# Patient Record
Sex: Female | Born: 1965 | Race: Black or African American | Hispanic: No | Smoking: Never smoker
Health system: Southern US, Community
[De-identification: ages and names within clinical notes are randomized; demographics above are authoritative.]

## PROBLEM LIST (undated history)

## (undated) DIAGNOSIS — E669 Obesity, unspecified: Secondary | ICD-10-CM

## (undated) DIAGNOSIS — K08409 Partial loss of teeth, unspecified cause, unspecified class: Secondary | ICD-10-CM

## (undated) HISTORY — DX: Obesity, unspecified: E66.9

---

## 2019-09-24 DIAGNOSIS — L821 Other seborrheic keratosis: Secondary | ICD-10-CM | POA: Diagnosis not present

## 2019-09-24 DIAGNOSIS — L811 Chloasma: Secondary | ICD-10-CM | POA: Diagnosis not present

## 2019-10-21 ENCOUNTER — Other Ambulatory Visit: Payer: Self-pay

## 2019-10-21 ENCOUNTER — Ambulatory Visit
Admission: EM | Admit: 2019-10-21 | Discharge: 2019-10-21 | Disposition: A | Discharge status: Home / Self Care | Payer: BC Managed Care – PPO | Attending: Family Medicine | Admitting: Family Medicine

## 2019-10-21 DIAGNOSIS — K029 Dental caries, unspecified: Secondary | ICD-10-CM | POA: Diagnosis not present

## 2019-10-21 MED ORDER — AMOXICILLIN-POT CLAVULANATE 875-125 MG PO TABS: 1.0000 | ORAL_TABLET | Freq: Two times a day (BID) | ORAL | 0 refills | Status: DC

## 2019-10-21 MED ORDER — KETOROLAC TROMETHAMINE 10 MG PO TABS: 10.0000 mg | ORAL_TABLET | Freq: Four times a day (QID) | ORAL | 0 refills | Status: DC | PRN

## 2019-10-21 NOTE — Discharge Instructions (Signed)
Medication as directed.  Find yourself a dentist.  Take care  Dr. Adriana Simas

## 2019-10-21 NOTE — ED Triage Notes (Signed)
Pt reports cracked upper left tooth.  Was in process of full dental eval then COVID hit and she didn't want to go to dentist.  Now new to area and needs to re-establish with dentist. Unable to drink hot/cold. Feels like air is hitting the tooth.  Constant tooth pain now causing headache.  Alternating Tylenol and Motrin isn't stopping pain.

## 2019-10-21 NOTE — ED Provider Notes (Signed)
[facility]    CSN: [id number] Arrival date & time: 10/21/19  1714      History   Chief Complaint Chief Complaint  Patient presents with  . Dental Pain   HPI  54 year old female presents with dental pain.  Patient reports that she has pain of a tooth of the left upper dentition that has been bothering her the past 2 days.  She states that she has broken the tooth.  This has been an ongoing issue intermittently.  It has been bothering her more so over the past 2 days.  She has not seen the dentist recently.  She has recently relocated to [place].  She states that she has worsening pain when she drinks hot and cold things.  Pain is constant.  8/10 in severity.  She states that the pain shoots up her face.  She is taking Tylenol Motrin without resolution.  No fever.  No jaw/facial swelling.  No other complaints or concerns at this time.   Past Surgical History:  Procedure Laterality Date  . CESAREAN SECTION  1994   OB History   No obstetric history on file.    Home Medications    Prior to Admission medications   Medication Sig Start Date End Date Taking? Authorizing Provider  amoxicillin-clavulanate (AUGMENTIN) 875-125 MG tablet Take 1 tablet by mouth every 12 (twelve) hours. 10/21/19   Coral Spikes, DO  ketorolac (TORADOL) 10 MG tablet Take 1 tablet (10 mg total) by mouth every 6 (six) hours as needed for moderate pain or severe pain. Do not take with ibuprofen or other NSAID's. 10/21/19   Coral Spikes, DO    Family History Family History  Problem Relation Age of Onset  . Healthy Father     Social History Social History   Tobacco Use  . Smoking status: Never Smoker  . Smokeless tobacco: Never Used  Substance Use Topics  . Alcohol use: Never  . Drug use: Never     Allergies   Percocet [oxycodone-acetaminophen]   Review of Systems Review of Systems  Constitutional: Negative for fever.  HENT: Positive for dental problem. Negative for facial  swelling.    Physical Exam Triage Vital Signs ED Triage Vitals  Enc Vitals Group     BP 10/21/19 1738 116/79     Pulse Rate 10/21/19 1738 88     Resp 10/21/19 1738 18     Temp 10/21/19 1738 98.3 F (36.8 C)     Temp Source 10/21/19 1738 Oral     SpO2 10/21/19 1738 100 %     Weight --      Height --      Head Circumference --      Peak Flow --      Pain Score 10/21/19 1731 8     Pain Loc --      Pain Edu? --      Excl. in [place]? --    Updated Vital Signs BP 116/79 (BP Location: Left Arm)   Pulse 88   Temp 98.3 F (36.8 C) (Oral)   Resp 18   LMP 10/15/2019 (Exact Date)   SpO2 100%   Visual Acuity Right Eye Distance:   Left Eye Distance:   Bilateral Distance:    Right Eye Near:   Left Eye Near:    Bilateral Near:     Physical Exam Vitals and nursing note reviewed.  Constitutional:      General: She is not in acute distress.  Appearance: Normal appearance. She is not ill-appearing.  HENT:     Head: Normocephalic and atraumatic.     Mouth/Throat:     Comments: Poor dentition throughout.  Patient has numerous decaying teeth. Eyes:     General:        Right eye: No discharge.        Left eye: No discharge.     Conjunctiva/sclera: Conjunctivae normal.  Cardiovascular:     Rate and Rhythm: Normal rate and regular rhythm.     Heart sounds: No murmur.  Pulmonary:     Effort: Pulmonary effort is normal.     Breath sounds: Normal breath sounds. No wheezing, rhonchi or rales.  Neurological:     Mental Status: She is alert.  Psychiatric:        Mood and Affect: Mood normal.        Behavior: Behavior normal.    UC Treatments / Results  Labs (all labs ordered are listed, but only abnormal results are displayed) Labs Reviewed - No data to display  EKG   Radiology No results found.  Procedures Procedures (including critical care time)  Medications Ordered in UC Medications - No data to display  Initial Impression / Assessment and Plan / UC Course  I  have reviewed the triage vital signs and the nursing notes.  Pertinent labs & imaging results that were available during my care of the patient were reviewed by me and considered in my medical decision making (see chart for details).    54 year old female presents with dental pain.  Covering with Augmentin.  Placing on Toradol for pain.  Information given regarding local dentist.  Supportive care.  Final Clinical Impressions(s) / UC Diagnoses   Final diagnoses:  Pain due to dental caries     Discharge Instructions     Medication as directed.  Find yourself a dentist.  Take care  Dr. Adriana Simas    ED Prescriptions    Medication Sig Dispense Auth. Provider   amoxicillin-clavulanate (AUGMENTIN) 875-125 MG tablet Take 1 tablet by mouth every 12 (twelve) hours. 20 tablet Velmer Woelfel G, DO   ketorolac (TORADOL) 10 MG tablet Take 1 tablet (10 mg total) by mouth every 6 (six) hours as needed for moderate pain or severe pain. Do not take with ibuprofen or other NSAID's. 20 tablet Tommie Sams, DO     PDMP not reviewed this encounter.   Tommie Sams, [place] 10/21/19 Paulo Fruit

## 2019-10-28 ENCOUNTER — Telehealth: Payer: Self-pay | Admitting: Emergency Medicine

## 2019-10-28 MED ORDER — IBUPROFEN 800 MG PO TABS: 800.0000 mg | ORAL_TABLET | Freq: Three times a day (TID) | ORAL | 0 refills | Status: DC

## 2019-10-28 NOTE — Telephone Encounter (Signed)
Patient called.Toradol 10 mg as prescribed to her is not working.  She is requesting Motrin 800 mg.  We will E prescribe this to the pharmacy on record.  Ibuprofen 800 mg 3 times daily for 10 days #30.   Domenick Gong, MD

## 2019-10-28 NOTE — Telephone Encounter (Signed)
Patient called requesting pain medicine be changed to motrin.  Patient states medicine ordered is not helping.  Patient is working on follow up care.    Spoke to dr [employer].  Dr Chaney Malling ordered motrin to listed pharmacy.   Notified patient medicine was called in to pharmacy.  Patient then requested pharmacy be changed to [facility].  Resent script to preferred pharmacy.    Patient reports she had difficulty with insurance and [facility], therefore requested switch.  Reported difficulty made getting medicine after [facility] visit difficult, reports it was 2 days later than expected.

## 2020-01-18 ENCOUNTER — Encounter (HOSPITAL_BASED_OUTPATIENT_CLINIC_OR_DEPARTMENT_OTHER): Payer: Self-pay | Admitting: Emergency Medicine

## 2020-01-18 ENCOUNTER — Emergency Department (HOSPITAL_BASED_OUTPATIENT_CLINIC_OR_DEPARTMENT_OTHER)
Admission: EM | Admit: 2020-01-18 | Discharge: 2020-01-18 | Disposition: A | Discharge status: Home / Self Care | Payer: BC Managed Care – PPO | Attending: Emergency Medicine | Admitting: Emergency Medicine

## 2020-01-18 ENCOUNTER — Ambulatory Visit (HOSPITAL_COMMUNITY)
Admission: EM | Admit: 2020-01-18 | Discharge: 2020-01-18 | Disposition: A | Discharge status: Home / Self Care | Payer: BC Managed Care – PPO | Source: Home / Self Care

## 2020-01-18 ENCOUNTER — Other Ambulatory Visit: Payer: Self-pay

## 2020-01-18 ENCOUNTER — Emergency Department (HOSPITAL_BASED_OUTPATIENT_CLINIC_OR_DEPARTMENT_OTHER): Payer: BC Managed Care – PPO

## 2020-01-18 DIAGNOSIS — M7632 Iliotibial band syndrome, left leg: Secondary | ICD-10-CM | POA: Diagnosis not present

## 2020-01-18 DIAGNOSIS — M79605 Pain in left leg: Secondary | ICD-10-CM | POA: Diagnosis not present

## 2020-01-18 MED ORDER — IBUPROFEN 800 MG PO TABS
800.0000 mg | ORAL_TABLET | Freq: Once | ORAL | Status: AC
  Administered 2020-01-18: 800 mg via ORAL
  Filled 2020-01-18: qty 1

## 2020-01-18 MED ORDER — ACETAMINOPHEN 500 MG PO TABS
1000.0000 mg | ORAL_TABLET | Freq: Once | ORAL | Status: AC
  Administered 2020-01-18: 1000 mg via ORAL
  Filled 2020-01-18: qty 2

## 2020-01-18 MED ORDER — IBUPROFEN 800 MG PO TABS: 800.0000 mg | ORAL_TABLET | Freq: Three times a day (TID) | ORAL | 0 refills | Status: DC

## 2020-01-18 MED ORDER — ACETAMINOPHEN 500 MG PO TABS: 1000.0000 mg | ORAL_TABLET | Freq: Four times a day (QID) | ORAL | 0 refills | Status: DC | PRN

## 2020-01-18 NOTE — ED Provider Notes (Signed)
[facility] EMERGENCY DEPARTMENT Provider Note   CSN: [id number] Arrival date & time: 01/18/20  1742     History Chief Complaint  Patient presents with  . Leg Pain    Kayla Frazier is a 54 y.o. female.  HPI Patient reports he started getting pain on the outside of her left knee about 3 days ago.  It was fairly uncomfortable but has gotten much worse.  She reports there is a very tender spot just on the outside aspect of her left knee and a little below the knee.  She reports the pain seems to radiate down the side of her leg a bit and then she noticed that her blood vessels on the front of her leg seemed really swollen earlier today.  She is worried about a blood clot to the leg.  She did start working out last week.  That was a new activity for her.  No specific injury.  She reports that it is worse if she turns her leg in her outwards.  She has not seen any actual swelling or redness.    History reviewed. No pertinent past medical history.  There are no problems to display for this patient.   Past Surgical History:  Procedure Laterality Date  . CESAREAN SECTION  1994     OB History   No obstetric history on file.     Family History  Problem Relation Age of Onset  . Healthy Father     Social History   Tobacco Use  . Smoking status: Never Smoker  . Smokeless tobacco: Never Used  Vaping Use  . Vaping Use: Never used  Substance Use Topics  . Alcohol use: Never  . Drug use: Never    Home Medications Prior to Admission medications   Medication Sig Start Date End Date Taking? Authorizing Provider  acetaminophen (TYLENOL) 500 MG tablet Take 2 tablets (1,000 mg total) by mouth every 6 (six) hours as needed. 01/18/20   Arby Barrette, MD  amoxicillin-clavulanate (AUGMENTIN) 875-125 MG tablet Take 1 tablet by mouth every 12 (twelve) hours. 10/21/19   Tommie Sams, DO  ibuprofen (ADVIL) 800 MG tablet Take 1 tablet (800 mg total) by mouth 3 (three) times  daily. 10/28/19   Domenick Gong, MD  ibuprofen (ADVIL) 800 MG tablet Take 1 tablet (800 mg total) by mouth 3 (three) times daily. 01/18/20   Arby Barrette, MD  ketorolac (TORADOL) 10 MG tablet Take 1 tablet (10 mg total) by mouth every 6 (six) hours as needed for moderate pain or severe pain. Do not take with ibuprofen or other NSAID's. 10/21/19   Tommie Sams, DO    Allergies    Percocet [oxycodone-acetaminophen]  Review of Systems   Review of Systems Constitutional: No fever no chills no malaise Respiratory: No chest pain no shortness of breath Physical Exam Updated Vital Signs BP 107/81 (BP Location: Right Arm)   Pulse 74   Temp 98.4 F (36.9 C) (Oral)   Resp 18   Ht 5\' 4"  (1.626 m)   Wt 86.2 kg   SpO2 100%   BMI 32.61 kg/m   Physical Exam Constitutional:      Comments: Alert and well in appearance.  No respiratory distress.  Pulmonary:     Effort: Pulmonary effort is normal.  Musculoskeletal:     Comments: Patient has focal area of tenderness on the lateral left knee.  This coincides with the iliotibial band.  No effusion to the left knee.  No  warmth or redness to the knee.  No fullness behind the knee in the popliteal fossa.  The calf is soft and pliable.  No remarkably engorged blood vessels.  Ankle without effusion.  Foot warm and dry 2+ pedal pulses bilateral feet.  Pain is exacerbated by internally and externally rotating the lower leg.  Skin:    General: Skin is warm and dry.  Neurological:     General: No focal deficit present.     Mental Status: She is oriented to person, place, and time.     Coordination: Coordination normal.  Psychiatric:        Mood and Affect: Mood normal.     ED Results / Procedures / Treatments   Labs (all labs ordered are listed, but only abnormal results are displayed) Labs Reviewed - No data to display  EKG None  Radiology US Venous Img Lower Unilateral Left  Result Date: 01/18/2020 CLINICAL DATA:  Left leg pain EXAM: LEFT  LOWER EXTREMITY VENOUS DOPPLER ULTRASOUND TECHNIQUE: Gray-scale sonography with compression, as well as color and duplex ultrasound, were performed to evaluate the deep venous system(s) from the level of the common femoral vein through the popliteal and proximal calf veins. COMPARISON:  None. FINDINGS: VENOUS Normal compressibility of the common femoral, superficial femoral, and popliteal veins, as well as the visualized calf veins. Visualized portions of profunda femoral vein and great saphenous vein unremarkable. No filling defects to suggest DVT on grayscale or color Doppler imaging. Doppler waveforms show normal direction of venous flow, normal respiratory plasticity and response to augmentation. Limited views of the contralateral common femoral vein are unremarkable. OTHER None. Limitations: none IMPRESSION: Negative. Electronically Signed   By: Rolm Baptise M.D.   On: 01/18/2020 19:40    Procedures Procedures (including critical care time)  Medications Ordered in ED Medications  ibuprofen (ADVIL) tablet 800 mg (800 mg Oral Given 01/18/20 2241)  acetaminophen (TYLENOL) tablet 1,000 mg (1,000 mg Oral Given 01/18/20 2241)    ED Course  I have reviewed the triage vital signs and the nursing notes.  Pertinent labs & imaging results that were available during my care of the patient were reviewed by me and considered in my medical decision making (see chart for details).    MDM Rules/Calculators/A&P                          Patient has developed a focus of tenderness to the lateral left knee.  No effusions present no erythema.  She had concern for DVT.  Ultrasound is negative.  Patient has recently started working out.  The focus of pain corresponds with the iliotibial band and radiates down the lateral leg somewhat.  Suspect musculoskeletal strain from overuse.  Will have patient use ibuprofen and Tylenol and icing.  Recommend follow-up with orthopedics for recheck. Final Clinical Impression(s) /  ED Diagnoses Final diagnoses:  It band syndrome, left    Rx / DC Orders ED Discharge Orders         Ordered    ibuprofen (ADVIL) 800 MG tablet  3 times daily     Discontinue  Reprint     01/18/20 2235    acetaminophen (TYLENOL) 500 MG tablet  Every 6 hours PRN     Discontinue  Reprint     01/18/20 2235           Charlesetta Shanks, MD 01/18/20 2247

## 2020-01-18 NOTE — Discharge Instructions (Signed)
Elevate and ice your knee. Take ibuprofen and tylenol for pain. Schedule a recheck with the orthopedic doctor, dr. Magnus Ivan. Return to the ED if you get additional or worsening symptoms.

## 2020-01-18 NOTE — ED Triage Notes (Signed)
L leg pain x 3 days. Denies injury.

## 2020-02-03 ENCOUNTER — Ambulatory Visit: Payer: BC Managed Care – PPO | Admitting: Emergency Medicine

## 2020-02-04 ENCOUNTER — Encounter: Payer: Self-pay | Admitting: Emergency Medicine

## 2020-02-17 ENCOUNTER — Telehealth: Payer: Self-pay | Admitting: General Practice

## 2020-02-17 NOTE — Telephone Encounter (Signed)
Called patient in response to online request to schedule as a new patient. LVM for patient to call the office to schedule. No preferences listed.

## 2020-03-15 ENCOUNTER — Ambulatory Visit: Payer: BC Managed Care – PPO | Admitting: Family Medicine

## 2020-04-16 ENCOUNTER — Ambulatory Visit: Payer: BC Managed Care – PPO | Admitting: Family Medicine

## 2020-04-16 ENCOUNTER — Other Ambulatory Visit: Payer: Self-pay

## 2020-04-16 ENCOUNTER — Encounter: Payer: Self-pay | Admitting: Family Medicine

## 2020-04-16 VITALS — BP 110/68 | HR 75 | Temp 97.4°F | Ht 63.0 in | Wt 200.0 lb

## 2020-04-16 DIAGNOSIS — R5383 Other fatigue: Secondary | ICD-10-CM | POA: Diagnosis not present

## 2020-04-16 DIAGNOSIS — E669 Obesity, unspecified: Secondary | ICD-10-CM | POA: Insufficient documentation

## 2020-04-16 DIAGNOSIS — Z0000 Encounter for general adult medical examination without abnormal findings: Secondary | ICD-10-CM | POA: Diagnosis not present

## 2020-04-16 NOTE — Progress Notes (Signed)
New Patient Office Visit  Subjective:  Patient ID: Kayla Frazier, female    DOB: November 21, 1965  Age: 54 y.o. MRN: [id number]  CC:  Chief Complaint  Patient presents with  . Establish Care    New patient     HPI Kayla Frazier presents for a physical exam and establishment of care.  She is particularly concerned about a 30 pound weight gain in the last 10 years of her life.  She does not feel that she has been overeating.  She otherwise feels healthy and has no chronic medical problems that she is aware of.  She is established with a dentist.  She asked for referral to a GYN provider.  She is not currently physically active.  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  She lives with her 22 year old son.  Her father is 77 and in relatively good health.  Her mom died in her 40s and had suffered from diabetes and chronic kidney disease. History reviewed. No pertinent past medical history.  Past Surgical History:  Procedure Laterality Date  . CESAREAN SECTION  1994  . CESAREAN SECTION N/A    [employer]rees[employer] 02/03/2020    Family History  Problem Relation Age of Onset  . Healthy Father     Social History   Socioeconomic History  . Marital status: Legally Separated    Spouse name: Not on file  . Number of children: Not on file  . Years of education: Not on file  . Highest education level: Not on file  Occupational History  . Not on file  Tobacco Use  . Smoking status: Never Smoker  . Smokeless tobacco: Never Used  Vaping Use  . Vaping Use: Never used  Substance and Sexual Activity  . Alcohol use: Never  . Drug use: Never  . Sexual activity: Not on file  Other Topics Concern  . Not on file  Social History Narrative  . Not on file   Social Determinants of Health   Financial Resource Strain:   . Difficulty of Paying Living Expenses: Not on file  Food Insecurity:   . Worried About Programme researcher, broadcasting/film/video in the Last Year: Not on file  . Ran Out of Food in the Last Year: Not on  file  Transportation Needs:   . Lack of Transportation (Medical): Not on file  . Lack of Transportation (Non-Medical): Not on file  Physical Activity:   . Days of Exercise per Week: Not on file  . Minutes of Exercise per Session: Not on file  Stress:   . Feeling of Stress : Not on file  Social Connections:   . Frequency of Communication with Friends and Family: Not on file  . Frequency of Social Gatherings with Friends and Family: Not on file  . Attends Religious Services: Not on file  . Active Member of Clubs or Organizations: Not on file  . Attends Banker Meetings: Not on file  . Marital Status: Not on file  Intimate Partner Violence:   . Fear of Current or Ex-Partner: Not on file  . Emotionally Abused: Not on file  . Physically Abused: Not on file  . Sexually Abused: Not on file    ROS Review of Systems  Constitutional: Negative.   HENT: Negative.   Eyes: Negative for photophobia and visual disturbance.  Respiratory: Negative.   Cardiovascular: Negative.   Gastrointestinal: Negative.   Endocrine: Negative for polyphagia and polyuria.  Genitourinary: Negative.   Musculoskeletal: Negative for gait problem  and joint swelling.  Skin: Negative for pallor and rash.  Allergic/Immunologic: Negative for immunocompromised state.  Neurological: Negative for tremors and speech difficulty.  Hematological: Does not bruise/bleed easily.  Psychiatric/Behavioral: Negative.        Objective:   Today's Vitals: BP 110/68   Pulse 75   Temp (!) 97.4 F (36.3 C) (Tympanic)   Ht 5\' 3"  (1.6 m)   Wt 200 lb (90.7 kg)   SpO2 98%   BMI 35.43 kg/m   Physical Exam Vitals and nursing note reviewed.  Constitutional:      General: She is not in acute distress.    Appearance: Normal appearance. She is obese. She is diaphoretic. She is not ill-appearing or toxic-appearing.  HENT:     Head: Normocephalic and atraumatic.     Right Ear: Ear canal and external ear normal.      Left Ear: Tympanic membrane, ear canal and external ear normal.     Mouth/Throat:     Mouth: Mucous membranes are moist.     Pharynx: Oropharynx is clear. No oropharyngeal exudate or posterior oropharyngeal erythema.  Eyes:     General: No scleral icterus.       Right eye: No discharge.        Left eye: No discharge.     Conjunctiva/sclera: Conjunctivae normal.     Pupils: Pupils are equal, round, and reactive to light.  Cardiovascular:     Rate and Rhythm: Normal rate and regular rhythm.  Pulmonary:     Effort: Pulmonary effort is normal.     Breath sounds: Normal breath sounds.  Abdominal:     General: Bowel sounds are normal.  Musculoskeletal:     Cervical back: Normal range of motion. No rigidity or tenderness.     Right lower leg: No edema.     Left lower leg: No edema.  Lymphadenopathy:     Cervical: No cervical adenopathy.  Skin:    General: Skin is warm.  Neurological:     Mental Status: She is alert and oriented to person, place, and time.  Psychiatric:        Mood and Affect: Mood normal.        Behavior: Behavior normal.     Assessment & Plan:   Problem List Items Addressed This Visit      Other   Fatigue   Relevant Orders   TSH   Healthcare maintenance - Primary   Relevant Orders   CBC   Comprehensive metabolic panel   HIV Antibody (routine testing w rflx)   Lipid panel   Urinalysis, Routine w reflex microscopic   Ambulatory referral to Gynecology   Ambulatory referral to Gastroenterology   Morbid obesity [facility])   Relevant Orders   Amb ref to Medical Nutrition Therapy-MNT      Outpatient Encounter Medications as of 04/16/2020  Medication Sig  . acetaminophen (TYLENOL) 500 MG tablet Take 2 tablets (1,000 mg total) by mouth every 6 (six) hours as needed.  . [DISCONTINUED] amoxicillin-clavulanate (AUGMENTIN) 875-125 MG tablet Take 1 tablet by mouth every 12 (twelve) hours. (Patient not taking: Reported on 04/16/2020)  . [DISCONTINUED] ibuprofen  (ADVIL) 800 MG tablet Take 1 tablet (800 mg total) by mouth 3 (three) times daily. (Patient not taking: Reported on 04/16/2020)  . [DISCONTINUED] ibuprofen (ADVIL) 800 MG tablet Take 1 tablet (800 mg total) by mouth 3 (three) times daily. (Patient not taking: Reported on 04/16/2020)  . [DISCONTINUED] ketorolac (TORADOL) 10 MG tablet Take 1 tablet (  10 mg total) by mouth every 6 (six) hours as needed for moderate pain or severe pain. Do not take with ibuprofen or other NSAID's. (Patient not taking: Reported on 04/16/2020)   No facility-administered encounter medications on file as of 04/16/2020.    Follow-up: Return in about 1 year (around 04/16/2021), or Return fasting for blood work..   Given information on health maintenance and disease prevention.  She was also given information on BMI and counting calories to lose weight.  Suggested that she write down everything that she eats and try to measure portions.  She agrees to meet with a nutritionist, follow-up with GYN and go for consultation for colonoscopy.  Mliss Sax, MD

## 2020-04-16 NOTE — Patient Instructions (Signed)
BMI for Adults What is BMI? Body mass index (BMI) is a number that is calculated from a person's weight and height. BMI can help estimate how much of a person's weight is composed of fat. BMI does not measure body fat directly. Rather, it is an alternative to procedures that directly measure body fat, which can be difficult and expensive. BMI can help identify people who may be at higher risk for certain medical problems. What are BMI measurements used for? BMI is used as a screening tool to identify possible weight problems. It helps determine whether a person is obese, overweight, a healthy weight, or underweight. BMI is useful for:  Identifying a weight problem that may be related to a medical condition or may increase the risk for medical problems.  Promoting changes, such as changes in diet and exercise, to help reach a healthy weight. BMI screening can be repeated to see if these changes are working. How is BMI calculated? BMI involves measuring your weight in relation to your height. Both height and weight are measured, and the BMI is calculated from those numbers. This can be done either in [place] ([place] or metric measurements. Note that charts and online BMI calculators are available to help you find your BMI quickly and easily without having to do these calculations yourself. To calculate your BMI in [place] ([place] measurements:  1. Measure your weight in pounds (lb). 2. Multiply the number of pounds by 703. ? For example, for a person who weighs 180 lb, multiply that number by 703, which equals 126,540. 3. Measure your height in inches. Then multiply that number by itself to get a measurement called "inches squared." ? For example, for a person who is 70 inches tall, the "inches squared" measurement is 70 inches x 70 inches, which equals 4,900 inches squared. 4. Divide the total from step 2 (number of lb x 703) by the total from step 3 (inches squared): 126,540  4,900 = 25.8. This is  your BMI. To calculate your BMI in metric measurements: 1. Measure your weight in kilograms (kg). 2. Measure your height in meters (m). Then multiply that number by itself to get a measurement called "meters squared." ? For example, for a person who is 1.75 m tall, the "meters squared" measurement is 1.75 m x 1.75 m, which is equal to 3.1 meters squared. 3. Divide the number of kilograms (your weight) by the meters squared number. In this example: 70  3.1 = 22.6. This is your BMI. What do the results mean? BMI charts are used to identify whether you are underweight, normal weight, overweight, or obese. The following guidelines will be used:  Underweight: BMI less than 18.5.  Normal weight: BMI between 18.5 and 24.9.  Overweight: BMI between 25 and 29.9.  Obese: BMI of 30 or above. Keep these notes in mind:  Weight includes both fat and muscle, so someone with a muscular build, such as an athlete, may have a BMI that is higher than 24.9. In cases like these, BMI is not an accurate measure of body fat.  To determine if excess body fat is the cause of a BMI of 25 or higher, further assessments may need to be done by a health care provider.  BMI is usually interpreted in the same way for men and women. Where to find more information For more information about BMI, including tools to quickly calculate your BMI, go to these websites:  Centers for Disease Control and Prevention: http://www.wolf.info/  American  Heart Association: www.heart.org  National Heart, Lung, and Blood Institute: https://wilson-eaton.com/ Summary  Body mass index (BMI) is a number that is calculated from a person's weight and height.  BMI may help estimate how much of a person's weight is composed of fat. BMI can help identify those who may be at higher risk for certain medical problems.  BMI can be measured using English measurements or metric measurements.  BMI charts are used to identify whether you are underweight, normal  weight, overweight, or obese. This information is not intended to replace advice given to you by your health care provider. Make sure you discuss any questions you have with your health care provider. Document Revised: 04/02/2019 Document Reviewed: 02/07/2019 Elsevier Patient Education  [place] Maintenance, Female Adopting a healthy lifestyle and getting preventive care are important in promoting health and wellness. Ask your health care provider about:  The right schedule for you to have regular tests and exams.  Things you can do on your own to prevent diseases and keep yourself healthy. What should I know about diet, weight, and exercise? Eat a healthy diet   Eat a diet that includes plenty of vegetables, fruits, low-fat dairy products, and lean protein.  Do not eat a lot of foods that are high in solid fats, added sugars, or sodium. Maintain a healthy weight Body mass index (BMI) is used to identify weight problems. It estimates body fat based on height and weight. Your health care provider can help determine your BMI and help you achieve or maintain a healthy weight. Get regular exercise Get regular exercise. This is one of the most important things you can do for your health. Most adults should:  Exercise for at least 150 minutes each week. The exercise should increase your heart rate and make you sweat (moderate-intensity exercise).  Do strengthening exercises at least twice a week. This is in addition to the moderate-intensity exercise.  Spend less time sitting. Even light physical activity can be beneficial. Watch cholesterol and blood lipids Have your blood tested for lipids and cholesterol at 52 years of age, then have this test every 5 years. Have your cholesterol levels checked more often if:  Your lipid or cholesterol levels are high.  You are older than 52 years of age.  You are at high risk for heart disease. What should I know about cancer  screening? Depending on your health history and family history, you may need to have cancer screening at various ages. This may include screening for:  Breast cancer.  Cervical cancer.  Colorectal cancer.  Skin cancer.  Lung cancer. What should I know about heart disease, diabetes, and high blood pressure? Blood pressure and heart disease  High blood pressure causes heart disease and increases the risk of stroke. This is more likely to develop in people who have high blood pressure readings, are of African descent, or are overweight.  Have your blood pressure checked: ? Every 3-5 years if you are 71-76 years of age. ? Every year if you are 45 years old or older. Diabetes Have regular diabetes screenings. This checks your fasting blood sugar level. Have the screening done:  Once every three years after age 45 if you are at a normal weight and have a low risk for diabetes.  More often and at a younger age if you are overweight or have a high risk for diabetes. What should I know about preventing infection? Hepatitis B If you have a higher risk  for hepatitis B, you should be screened for this virus. Talk with your health care provider to find out if you are at risk for hepatitis B infection. Hepatitis C Testing is recommended for:  Everyone born from [birth year] through 1963.  Anyone with known risk factors for hepatitis C. Sexually transmitted infections (STIs)  Get screened for STIs, including gonorrhea and chlamydia, if: ? You are sexually active and are younger than 52 years of age. ? You are older than 52 years of age and your health care provider tells you that you are at risk for this type of infection. ? Your sexual activity has changed since you were last screened, and you are at increased risk for chlamydia or gonorrhea. Ask your health care provider if you are at risk.  Ask your health care provider about whether you are at high risk for HIV. Your health care provider may  recommend a prescription medicine to help prevent HIV infection. If you choose to take medicine to prevent HIV, you should first get tested for HIV. You should then be tested every 3 months for as long as you are taking the medicine. Pregnancy  If you are about to stop having your period (premenopausal) and you may become pregnant, seek counseling before you get pregnant.  Take 400 to 800 micrograms (mcg) of folic acid every day if you become pregnant.  Ask for birth control (contraception) if you want to prevent pregnancy. Osteoporosis and menopause Osteoporosis is a disease in which the bones lose minerals and strength with aging. This can result in bone fractures. If you are 82 years old or older, or if you are at risk for osteoporosis and fractures, ask your health care provider if you should:  Be screened for bone loss.  Take a calcium or vitamin D supplement to lower your risk of fractures.  Be given hormone replacement therapy (HRT) to treat symptoms of menopause. Follow these instructions at home: Lifestyle  Do not use any products that contain nicotine or tobacco, such as cigarettes, e-cigarettes, and chewing tobacco. If you need help quitting, ask your health care provider.  Do not use street drugs.  Do not share needles.  Ask your health care provider for help if you need support or information about quitting drugs. Alcohol use  Do not drink alcohol if: ? Your health care provider tells you not to drink. ? You are pregnant, may be pregnant, or are planning to become pregnant.  If you drink alcohol: ? Limit how much you use to 0-1 drink a day. ? Limit intake if you are breastfeeding.  Be aware of how much alcohol is in your drink. In the [place]., one drink equals one 12 oz bottle of beer (355 mL), one 5 oz glass of wine (148 mL), or one 1 oz glass of hard liquor (44 mL). General instructions  Schedule regular health, dental, and eye exams.  Stay current with your  vaccines.  Tell your health care provider if: ? You often feel depressed. ? You have ever been abused or do not feel safe at home. Summary  Adopting a healthy lifestyle and getting preventive care are important in promoting health and wellness.  Follow your health care provider's instructions about healthy diet, exercising, and getting tested or screened for diseases.  Follow your health care provider's instructions on monitoring your cholesterol and blood pressure. This information is not intended to replace advice given to you by your health care provider. Make sure you discuss any  questions you have with your health care provider. Document Revised: 07/03/2018 Document Reviewed: 07/03/2018 Elsevier Patient Education  2020 Elsevier Inc.  Preventive Care 54-59 Years Old, Female Preventive care refers to visits with your health care provider and lifestyle choices that can promote health and wellness. This includes:  A yearly physical exam. This may also be called an annual well check.  Regular dental visits and eye exams.  Immunizations.  Screening for certain conditions.  Healthy lifestyle choices, such as eating a healthy diet, getting regular exercise, not using drugs or products that contain nicotine and tobacco, and limiting alcohol use. What can I expect for my preventive care visit? Physical exam Your health care provider will check your:  Height and weight. This may be used to calculate body mass index (BMI), which tells if you are at a healthy weight.  Heart rate and blood pressure.  Skin for abnormal spots. Counseling Your health care provider may ask you questions about your:  Alcohol, tobacco, and drug use.  Emotional well-being.  Home and relationship well-being.  Sexual activity.  Eating habits.  Work and work Statistician.  Method of birth control.  Menstrual cycle.  Pregnancy history. What immunizations do I need?  Influenza (flu)  vaccine  This is recommended every year. Tetanus, diphtheria, and pertussis (Tdap) vaccine  You may need a Td booster every 10 years. Varicella (chickenpox) vaccine  You may need this if you have not been vaccinated. Zoster (shingles) vaccine  You may need this after age 51. Measles, mumps, and rubella (MMR) vaccine  You may need at least one dose of MMR if you were born in 1957 or later. You may also need a second dose. Pneumococcal conjugate (PCV13) vaccine  You may need this if you have certain conditions and were not previously vaccinated. Pneumococcal polysaccharide (PPSV23) vaccine  You may need one or two doses if you smoke cigarettes or if you have certain conditions. Meningococcal conjugate (MenACWY) vaccine  You may need this if you have certain conditions. Hepatitis A vaccine  You may need this if you have certain conditions or if you travel or work in places where you may be exposed to hepatitis A. Hepatitis B vaccine  You may need this if you have certain conditions or if you travel or work in places where you may be exposed to hepatitis B. Haemophilus influenzae type b (Hib) vaccine  You may need this if you have certain conditions. Human papillomavirus (HPV) vaccine  If recommended by your health care provider, you may need three doses over 6 months. You may receive vaccines as individual doses or as more than one vaccine together in one shot (combination vaccines). Talk with your health care provider about the risks and benefits of combination vaccines. What tests do I need? Blood tests  Lipid and cholesterol levels. These may be checked every 5 years, or more frequently if you are over 2 years old.  Hepatitis C test.  Hepatitis B test. Screening  Lung cancer screening. You may have this screening every year starting at age 34 if you have a 30-pack-year history of smoking and currently smoke or have quit within the past 15 years.  Colorectal cancer  screening. All adults should have this screening starting at age 61 and continuing until age 19. Your health care provider may recommend screening at age 31 if you are at increased risk. You will have tests every 1-10 years, depending on your results and the type of screening test.  Diabetes  screening. This is done by checking your blood sugar (glucose) after you have not eaten for a while (fasting). You may have this done every 1-3 years.  Mammogram. This may be done every 1-2 years. Talk with your health care provider about when you should start having regular mammograms. This may depend on whether you have a family history of breast cancer.  BRCA-related cancer screening. This may be done if you have a family history of breast, ovarian, tubal, or peritoneal cancers.  Pelvic exam and Pap test. This may be done every 3 years starting at age 61. Starting at age 72, this may be done every 5 years if you have a Pap test in combination with an HPV test. Other tests  Sexually transmitted disease (STD) testing.  Bone density scan. This is done to screen for osteoporosis. You may have this scan if you are at high risk for osteoporosis. Follow these instructions at home: Eating and drinking  Eat a diet that includes fresh fruits and vegetables, whole grains, lean protein, and low-fat dairy.  Take vitamin and mineral supplements as recommended by your health care provider.  Do not drink alcohol if: ? Your health care provider tells you not to drink. ? You are pregnant, may be pregnant, or are planning to become pregnant.  If you drink alcohol: ? Limit how much you have to 0-1 drink a day. ? Be aware of how much alcohol is in your drink. In the [place] one drink equals one 12 oz bottle of beer (355 mL), one 5 oz glass of wine (148 mL), or one 1 oz glass of hard liquor (44 mL). Lifestyle  Take daily care of your teeth and gums.  Stay active. Exercise for at least 30 minutes on 5 or more days  each week.  Do not use any products that contain nicotine or tobacco, such as cigarettes, e-cigarettes, and chewing tobacco. If you need help quitting, ask your health care provider.  If you are sexually active, practice safe sex. Use a condom or other form of birth control (contraception) in order to prevent pregnancy and STIs (sexually transmitted infections).  If told by your health care provider, take low-dose aspirin daily starting at age 96. What's next?  Visit your health care provider once a year for a well check visit.  Ask your health care provider how often you should have your eyes and teeth checked.  Stay up to date on all vaccines. This information is not intended to replace advice given to you by your health care provider. Make sure you discuss any questions you have with your health care provider. Document Revised: 03/21/2018 Document Reviewed: 03/21/2018 Elsevier Patient Education  2020 [place] [employer] Calories are units of energy. Your body needs a certain amount of calories from food to keep you going throughout the day. When you eat more calories than your body needs, your body stores the extra calories as fat. When you eat fewer calories than your body needs, your body burns fat to get the energy it needs. Calorie counting means keeping track of how many calories you eat and drink each day. Calorie counting can be helpful if you need to lose weight. If you make sure to eat fewer calories than your body needs, you should lose weight. Ask your health care provider what a healthy weight is for you. For calorie counting to work, you will need to eat the right number of calories in a day  in order to lose a healthy amount of weight per week. A dietitian can help you determine how many calories you need in a day and will give you suggestions on how to reach your calorie goal.  A healthy amount of weight to lose per week is usually 1-2 lb (0.5-0.9  kg). This usually means that your daily calorie intake should be reduced by 500-750 calories.  Eating 1,200 - 1,500 calories per day can help most women lose weight.  Eating 1,500 - 1,800 calories per day can help most men lose weight. What is my plan? My goal is to have __________ calories per day. If I have this many calories per day, I should lose around __________ pounds per week. What do I need to know about calorie counting? In order to meet your daily calorie goal, you will need to:  Find out how many calories are in each food you would like to eat. Try to do this before you eat.  Decide how much of the food you plan to eat.  Write down what you ate and how many calories it had. Doing this is called keeping a food log. To successfully lose weight, it is important to balance calorie counting with a healthy lifestyle that includes regular activity. Aim for 150 minutes of moderate exercise (such as walking) or 75 minutes of vigorous exercise (such as running) each week. Where do I find calorie information?  The number of calories in a food can be found on a Nutrition Facts label. If a food does not have a Nutrition Facts label, try to look up the calories online or ask your dietitian for help. Remember that calories are listed per serving. If you choose to have more than one serving of a food, you will have to multiply the calories per serving by the amount of servings you plan to eat. For example, the label on a package of bread might say that a serving size is 1 slice and that there are 90 calories in a serving. If you eat 1 slice, you will have eaten 90 calories. If you eat 2 slices, you will have eaten 180 calories. How do I keep a food log? Immediately after each meal, record the following information in your food log:  What you ate. Don't forget to include toppings, sauces, and other extras on the food.  How much you ate. This can be measured in cups, ounces, or number of  items.  How many calories each food and drink had.  The total number of calories in the meal. Keep your food log near you, such as in a small notebook in your pocket, or use a mobile app or website. Some programs will calculate calories for you and show you how many calories you have left for the day to meet your goal. What are some calorie counting tips?   Use your calories on foods and drinks that will fill you up and not leave you hungry: ? Some examples of foods that fill you up are nuts and nut butters, vegetables, lean proteins, and high-fiber foods like whole grains. High-fiber foods are foods with more than 5 g fiber per serving. ? Drinks such as sodas, specialty coffee drinks, alcohol, and juices have a lot of calories, yet do not fill you up.  Eat nutritious foods and avoid empty calories. Empty calories are calories you get from foods or beverages that do not have many vitamins or protein, such as candy, sweets, and soda. It  is better to have a nutritious high-calorie food (such as an avocado) than a food with few nutrients (such as a bag of chips).  Know how many calories are in the foods you eat most often. This will help you calculate calorie counts faster.  Pay attention to calories in drinks. Low-calorie drinks include water and unsweetened drinks.  Pay attention to nutrition labels for "low fat" or "fat free" foods. These foods sometimes have the same amount of calories or more calories than the full fat versions. They also often have added sugar, starch, or salt, to make up for flavor that was removed with the fat.  Find a way of tracking calories that works for you. Get creative. Try different apps or programs if writing down calories does not work for you. What are some portion control tips?  Know how many calories are in a serving. This will help you know how many servings of a certain food you can have.  Use a measuring cup to measure serving sizes. You could also try  weighing out portions on a kitchen scale. With time, you will be able to estimate serving sizes for some foods.  Take some time to put servings of different foods on your favorite plates, bowls, and cups so you know what a serving looks like.  Try not to eat straight from a bag or box. Doing this can lead to overeating. Put the amount you would like to eat in a cup or on a plate to make sure you are eating the right portion.  Use smaller plates, glasses, and bowls to prevent overeating.  Try not to multitask (for example, watch TV or use your computer) while eating. If it is time to eat, sit down at a table and enjoy your food. This will help you to know when you are full. It will also help you to be aware of what you are eating and how much you are eating. What are tips for following this plan? Reading food labels  Check the calorie count compared to the serving size. The serving size may be smaller than what you are used to eating.  Check the source of the calories. Make sure the food you are eating is high in vitamins and protein and low in saturated and trans fats. Shopping  Read nutrition labels while you shop. This will help you make healthy decisions before you decide to purchase your food.  Make a grocery list and stick to it. Cooking  Try to cook your favorite foods in a healthier way. For example, try baking instead of frying.  Use low-fat dairy products. Meal planning  Use more fruits and vegetables. Half of your plate should be fruits and vegetables.  Include lean proteins like poultry and fish. How do I count calories when eating out?  Ask for smaller portion sizes.  Consider sharing an entree and sides instead of getting your own entree.  If you get your own entree, eat only half. Ask for a box at the beginning of your meal and put the rest of your entree in it so you are not tempted to eat it.  If calories are listed on the menu, choose the lower calorie  options.  Choose dishes that include vegetables, fruits, whole grains, low-fat dairy products, and lean protein.  Choose items that are boiled, broiled, grilled, or steamed. Stay away from items that are buttered, battered, fried, or served with cream sauce. Items labeled "crispy" are usually fried, unless stated  otherwise.  Choose water, low-fat milk, unsweetened iced tea, or other drinks without added sugar. If you want an alcoholic beverage, choose a lower calorie option such as a glass of wine or light beer.  Ask for dressings, sauces, and syrups on the side. These are usually high in calories, so you should limit the amount you eat.  If you want a salad, choose a garden salad and ask for grilled meats. Avoid extra toppings like bacon, cheese, or fried items. Ask for the dressing on the side, or ask for olive oil and vinegar or lemon to use as dressing.  Estimate how many servings of a food you are given. For example, a serving of cooked rice is  cup or about the size of half a baseball. Knowing serving sizes will help you be aware of how much food you are eating at restaurants. The list below tells you how big or small some common portion sizes are based on everyday objects: ? 1 oz--4 stacked dice. ? 3 oz--1 deck of cards. ? 1 tsp--1 die. ? 1 Tbsp-- a ping-pong ball. ? 2 Tbsp--1 ping-pong ball. ?  cup-- baseball. ? 1 cup--1 baseball. Summary  Calorie counting means keeping track of how many calories you eat and drink each day. If you eat fewer calories than your body needs, you should lose weight.  A healthy amount of weight to lose per week is usually 1-2 lb (0.5-0.9 kg). This usually means reducing your daily calorie intake by 500-750 calories.  The number of calories in a food can be found on a Nutrition Facts label. If a food does not have a Nutrition Facts label, try to look up the calories online or ask your dietitian for help.  Use your calories on foods and drinks that  will fill you up, and not on foods and drinks that will leave you hungry.  Use smaller plates, glasses, and bowls to prevent overeating. This information is not intended to replace advice given to you by your health care provider. Make sure you discuss any questions you have with your health care provider. Document Revised: 03/29/2018 Document Reviewed: 06/09/2016 Elsevier Patient Education  [place].

## 2020-04-20 ENCOUNTER — Other Ambulatory Visit: Payer: Self-pay

## 2020-04-20 ENCOUNTER — Other Ambulatory Visit (INDEPENDENT_AMBULATORY_CARE_PROVIDER_SITE_OTHER): Payer: BC Managed Care – PPO

## 2020-04-20 DIAGNOSIS — R5383 Other fatigue: Secondary | ICD-10-CM

## 2020-04-20 DIAGNOSIS — H524 Presbyopia: Secondary | ICD-10-CM | POA: Diagnosis not present

## 2020-04-20 DIAGNOSIS — Z0100 Encounter for examination of eyes and vision without abnormal findings: Secondary | ICD-10-CM | POA: Diagnosis not present

## 2020-04-20 DIAGNOSIS — Z0000 Encounter for general adult medical examination without abnormal findings: Secondary | ICD-10-CM | POA: Diagnosis not present

## 2020-04-20 DIAGNOSIS — H5202 Hypermetropia, left eye: Secondary | ICD-10-CM | POA: Diagnosis not present

## 2020-04-20 LAB — LIPID PANEL
Cholesterol: 185 mg/dL (ref 0–200)
HDL: 57 mg/dL (ref >39.00)
LDL Cholesterol: 113 mg/dL — ABNORMAL HIGH (ref 0–99)
NonHDL: 127.83
Total CHOL/HDL Ratio: 3
Triglycerides: 75 mg/dL (ref 0.0–149.0)
VLDL: 15 mg/dL (ref 0.0–40.0)

## 2020-04-20 LAB — CBC
HCT: 38.7 % (ref 36.0–46.0)
Hemoglobin: 12.9 g/dL (ref 12.0–15.0)
MCHC: 33.2 g/dL (ref 30.0–36.0)
MCV: 95.3 fl (ref 78.0–100.0)
Platelets: 199 10*3/uL (ref 150.0–400.0)
RBC: 4.06 Mil/uL (ref 3.87–5.11)
RDW: 13.7 % (ref 11.5–15.5)
WBC: 5.2 10*3/uL (ref 4.0–10.5)

## 2020-04-20 LAB — URINALYSIS, ROUTINE W REFLEX MICROSCOPIC
Ketones, ur: NEGATIVE
Leukocytes,Ua: NEGATIVE
Nitrite: NEGATIVE
Specific Gravity, Urine: 1.02 (ref 1.000–1.030)
Total Protein, Urine: NEGATIVE
Urine Glucose: NEGATIVE
Urobilinogen, UA: 0.2 (ref 0.0–1.0)
pH: 6.5 (ref 5.0–8.0)

## 2020-04-20 LAB — COMPREHENSIVE METABOLIC PANEL
ALT: 16 U/L (ref 0–35)
AST: 20 U/L (ref 0–37)
Albumin: 4.1 g/dL (ref 3.5–5.2)
Alkaline Phosphatase: 58 U/L (ref 39–117)
BUN: 6 mg/dL (ref 6–23)
CO2: 28 mEq/L (ref 19–32)
Calcium: 9.2 mg/dL (ref 8.4–10.5)
Chloride: 103 mEq/L (ref 96–112)
Creatinine, Ser: 0.95 mg/dL (ref 0.40–1.20)
GFR: 74.74 mL/min (ref >60.00)
Glucose, Bld: 95 mg/dL (ref 70–99)
Potassium: 4.4 mEq/L (ref 3.5–5.1)
Sodium: 138 mEq/L (ref 135–145)
Total Bilirubin: 0.5 mg/dL (ref 0.2–1.2)
Total Protein: 7.4 g/dL (ref 6.0–8.3)

## 2020-04-20 LAB — TSH: TSH: 1.03 u[IU]/mL (ref 0.35–4.50)

## 2020-04-20 NOTE — Addendum Note (Signed)
Addended by: Varney Biles on: 04/20/2020 11:12 AM   Modules accepted: Orders

## 2020-05-12 ENCOUNTER — Ambulatory Visit: Payer: BC Managed Care – PPO | Admitting: Nurse Practitioner

## 2020-05-18 ENCOUNTER — Other Ambulatory Visit: Payer: Self-pay

## 2020-05-18 ENCOUNTER — Encounter: Payer: Self-pay | Admitting: Nurse Practitioner

## 2020-05-18 ENCOUNTER — Ambulatory Visit (INDEPENDENT_AMBULATORY_CARE_PROVIDER_SITE_OTHER): Payer: BC Managed Care – PPO | Admitting: Nurse Practitioner

## 2020-05-18 VITALS — BP 118/78 | Ht 63.0 in | Wt 200.0 lb

## 2020-05-18 DIAGNOSIS — Z01419 Encounter for gynecological examination (general) (routine) without abnormal findings: Secondary | ICD-10-CM | POA: Diagnosis not present

## 2020-05-18 NOTE — Progress Notes (Signed)
   Kayla Frazier February 02, 1966 [id number]   History:  54 y.o. G7P0017 presents to establish care. No GYN complaints. Mostly regular monthly cycles. Denies menopausal symptoms. Not currently sexually active. Normal pap and mammogram history.   Gynecologic History Patient's last menstrual period was 03/24/2020. Menstrual Flow: Heavy, Moderate Menstrual Control: Maxi pad, Tampon Dysmenorrhea: (!) Mild Dysmenorrhea Symptoms: Cramping Contraception: abstinence Last Pap: 2019. Results were: normal Last mammogram: 2020. Results were: normal Last colonoscopy: Never  Past medical history, past surgical history, family history and social history were all reviewed and documented in the EPIC chart.  ROS:  A ROS was performed and pertinent positives and negatives are included.  Exam:  Vitals:   05/18/20 1508  BP: 118/78  Weight: 200 lb (90.7 kg)  Height: 5\' 3"  (1.6 m)   Body mass index is 35.43 kg/m.  General appearance:  Normal Thyroid:  Symmetrical, normal in size, without palpable masses or nodularity. Respiratory  Auscultation:  Clear without wheezing or rhonchi Cardiovascular  Auscultation:  Regular rate, without rubs, murmurs or gallops  Edema/varicosities:  Not grossly evident Abdominal  Soft,nontender, without masses, guarding or rebound.  Liver/spleen:  No organomegaly noted  Hernia:  None appreciated  Skin  Inspection:  Grossly normal   Breasts: Examined lying and sitting.   Right: Without masses, retractions, discharge or axillary adenopathy.   Left: Without masses, retractions, discharge or axillary adenopathy. Gentitourinary   Inguinal/mons:  Normal without inguinal adenopathy  External genitalia:  Normal  BUS/Urethra/Skene's glands:  Normal  Vagina:  Normal  Cervix:  Normal  Uterus:  Normal in size, shape and contour.  Midline and mobile  Adnexa/parametria:     Rt: Without masses or tenderness.   Lt: Without masses or tenderness.  Anus and  perineum: Normal  Digital rectal exam: Normal sphincter tone without palpated masses or tenderness  Assessment/Plan:  54 y.o. G7P0017 to establish care.   Well female exam with routine gynecological exam - Education provided on SBEs, importance of preventative screenings, current guidelines, high calcium diet, regular exercise, and multivitamin daily. Labs with PCP.   Screening for cervical cancer - normal pap history per patient. Most recent in 2019. Will repeat at 5-year interval.  Screening for breast cancer - normal mammogram history. Normal breast exam today. She is overdue for screening mammogram. Discussed current guidelines and importance of preventative screenings. Information provided on [facility] and she plans to schedule this soon.   Screening for colon cancer - has not had screening colonoscopy.  Discussed current guidelines and importance of preventative screenings.  She is not interested in a colonoscopy. I recommended doing Cologuard with PCP.  Follow up in 1 year for annual.     2020 [facility], 3:20 PM 05/18/2020

## 2020-05-18 NOTE — Patient Instructions (Addendum)
Schedule colonoscopy [facility] [phone] [address] [place], [place] [address]  Schedule mammogram! [facility] [phone] [address]  [place], [place] [address]  Health Maintenance, Female Adopting a healthy lifestyle and getting preventive care are important in promoting health and wellness. Ask your health care provider about:  The right schedule for you to have regular tests and exams.  Things you can do on your own to prevent diseases and keep yourself healthy. What should I know about diet, weight, and exercise? Eat a healthy diet   Eat a diet that includes plenty of vegetables, fruits, low-fat dairy products, and lean protein.  Do not eat a lot of foods that are high in solid fats, added sugars, or sodium. Maintain a healthy weight Body mass index (BMI) is used to identify weight problems. It estimates body fat based on height and weight. Your health care provider can help determine your BMI and help you achieve or maintain a healthy weight. Get regular exercise Get regular exercise. This is one of the most important things you can do for your health. Most adults should:  Exercise for at least 150 minutes each week. The exercise should increase your heart rate and make you sweat (moderate-intensity exercise).  Do strengthening exercises at least twice a week. This is in addition to the moderate-intensity exercise.  Spend less time sitting. Even light physical activity can be beneficial. Watch cholesterol and blood lipids Have your blood tested for lipids and cholesterol at 52 years of age, then have this test every 5 years. Have your cholesterol levels checked more often if:  Your lipid or cholesterol levels are high.  You are older than 52 years of age.  You are at high risk for heart disease. What should I know about cancer screening? Depending on your health history and family history, you may need to have cancer screening  at various ages. This may include screening for:  Breast cancer.  Cervical cancer.  Colorectal cancer.  Skin cancer.  Lung cancer. What should I know about heart disease, diabetes, and high blood pressure? Blood pressure and heart disease  High blood pressure causes heart disease and increases the risk of stroke. This is more likely to develop in people who have high blood pressure readings, are of African descent, or are overweight.  Have your blood pressure checked: ? Every 3-5 years if you are 58-76 years of age. ? Every year if you are 38 years old or older. Diabetes Have regular diabetes screenings. This checks your fasting blood sugar level. Have the screening done:  Once every three years after age 43 if you are at a normal weight and have a low risk for diabetes.  More often and at a younger age if you are overweight or have a high risk for diabetes. What should I know about preventing infection? Hepatitis B If you have a higher risk for hepatitis B, you should be screened for this virus. Talk with your health care provider to find out if you are at risk for hepatitis B infection. Hepatitis C Testing is recommended for:  Everyone born from [birth year] through 1963.  Anyone with known risk factors for hepatitis C. Sexually transmitted infections (STIs)  Get screened for STIs, including gonorrhea and chlamydia, if: ? You are sexually active and are younger than 52 years of age. ? You are older than 52 years of age and your health care provider tells you that you are at  risk for this type of infection. ? Your sexual activity has changed since you were last screened, and you are at increased risk for chlamydia or gonorrhea. Ask your health care provider if you are at risk.  Ask your health care provider about whether you are at high risk for HIV. Your health care provider may recommend a prescription medicine to help prevent HIV infection. If you choose to take medicine to  prevent HIV, you should first get tested for HIV. You should then be tested every 3 months for as long as you are taking the medicine. Pregnancy  If you are about to stop having your period (premenopausal) and you may become pregnant, seek counseling before you get pregnant.  Take 400 to 800 micrograms (mcg) of folic acid every day if you become pregnant.  Ask for birth control (contraception) if you want to prevent pregnancy. Osteoporosis and menopause Osteoporosis is a disease in which the bones lose minerals and strength with aging. This can result in bone fractures. If you are 65 years old or older, or if you are at risk for osteoporosis and fractures, ask your health care provider if you should:  Be screened for bone loss.  Take a calcium or vitamin D supplement to lower your risk of fractures.  Be given hormone replacement therapy (HRT) to treat symptoms of menopause. Follow these instructions at home: Lifestyle  Do not use any products that contain nicotine or tobacco, such as cigarettes, e-cigarettes, and chewing tobacco. If you need help quitting, ask your health care provider.  Do not use street drugs.  Do not share needles.  Ask your health care provider for help if you need support or information about quitting drugs. Alcohol use  Do not drink alcohol if: ? Your health care provider tells you not to drink. ? You are pregnant, may be pregnant, or are planning to become pregnant.  If you drink alcohol: ? Limit how much you use to 0-1 drink a day. ? Limit intake if you are breastfeeding.  Be aware of how much alcohol is in your drink. In the [place] one drink equals one 12 oz bottle of beer (355 mL), one 5 oz glass of wine (148 mL), or one 1 oz glass of hard liquor (44 mL). General instructions  Schedule regular health, dental, and eye exams.  Stay current with your vaccines.  Tell your health care provider if: ? You often feel depressed. ? You have ever been  abused or do not feel safe at home. Summary  Adopting a healthy lifestyle and getting preventive care are important in promoting health and wellness.  Follow your health care provider's instructions about healthy diet, exercising, and getting tested or screened for diseases.  Follow your health care provider's instructions on monitoring your cholesterol and blood pressure. This information is not intended to replace advice given to you by your health care provider. Make sure you discuss any questions you have with your health care provider. Document Revised: 07/03/2018 Document Reviewed: 07/03/2018 Elsevier Patient Education  2020 Elsevier Inc.  

## 2020-06-08 ENCOUNTER — Encounter: Payer: BC Managed Care – PPO | Attending: Family Medicine | Admitting: Dietician

## 2020-06-08 ENCOUNTER — Encounter: Payer: Self-pay | Admitting: Dietician

## 2020-06-08 ENCOUNTER — Other Ambulatory Visit: Payer: Self-pay

## 2020-06-08 DIAGNOSIS — E669 Obesity, unspecified: Secondary | ICD-10-CM | POA: Diagnosis not present

## 2020-06-08 NOTE — Progress Notes (Signed)
Medical Nutrition Therapy   Primary concerns today: weight management, metabolism, plant-based eating  Referral diagnosis: E66.01- obesity Preferred learning style: no preference indicated Learning readiness: ready   NUTRITION ASSESSMENT   Anthropometrics  Weight: 199.8 lbs   Clinical Medical Hx: obesity Medications: N/A Labs: N/A Notable Signs/Symptoms: N/A  Lifestyle & Dietary Hx Patient states she currently does not eat meat. States she never ate beef or pork, and recently cut out other meats. May have grilled fish. Eats a lot of nuts, greens, and salads. Does not really eat out or snack on junk food, states she rarely eats out of "bags" or "boxes." States she is concerned that her metabolism has slowed and would like to boost it to lose some weight. States she has noticed her clothes fit more loosely since cutting out meat, despite her not losing weight based on her numbers on the scale.   Estimated daily fluid intake: 90+ oz Supplements: MVI, vitamin D, zinc  Sleep: 5-6 hours/night Stress / self-care: manages well Current average weekly physical activity: light walking   24-Hr Dietary Recall First Meal: - Snack: - Second Meal: soup  Snack: mixed nuts Third Meal: salad Snack: - Beverages: water, green tea   NUTRITION DIAGNOSIS  Predicted inadequate nutrient intake (NI-5.11.1) related to misconceptions about calorie needs as evidenced by patient reported dietary recall reflecting calorie intake which is lower than estimated needs.    NUTRITION INTERVENTION  Nutrition education (E-1) on the following topics:  . General balanced eating for metabolism support and weight management: adequate water/fluid intake, fiber, protein, limiting added sugars and saturated fats, activity level, stress management, etc.  . Plant protein sources   Handouts Provided Include   MyPlate & Meal Ideas   Breakfast Ideas  Plant Protein   Plant-Based Sample Meal Plan (1,600  calories/day)   Learning Style & Readiness for Change Teaching method utilized: Visual & Auditory  Demonstrated degree of understanding via: Teach Back  Barriers to learning/adherence to lifestyle change: None Identified    MONITORING & EVALUATION Dietary intake, weekly physical activity, and goals PRN.  Next Steps  Patient is to contact [facility] as needed with question, concerns, or to request follow up visit.

## 2020-07-29 ENCOUNTER — Other Ambulatory Visit: Payer: BC Managed Care – PPO

## 2020-08-06 DIAGNOSIS — Z1152 Encounter for screening for COVID-19: Secondary | ICD-10-CM | POA: Diagnosis not present

## 2020-09-01 ENCOUNTER — Telehealth: Payer: Self-pay | Admitting: *Deleted

## 2020-09-01 NOTE — Telephone Encounter (Signed)
Patient no show PV today. Called patient x2, no answer, unable to leave a message due to "mail box is full". Colon and PV cancelled and mailed no show letter to the patient.

## 2020-09-15 ENCOUNTER — Encounter: Payer: BC Managed Care – PPO | Admitting: Gastroenterology

## 2021-02-07 DIAGNOSIS — Z23 Encounter for immunization: Secondary | ICD-10-CM | POA: Diagnosis not present

## 2021-02-07 DIAGNOSIS — Z13220 Encounter for screening for lipoid disorders: Secondary | ICD-10-CM | POA: Diagnosis not present

## 2021-02-07 DIAGNOSIS — E663 Overweight: Secondary | ICD-10-CM | POA: Diagnosis not present

## 2021-02-07 DIAGNOSIS — Z0000 Encounter for general adult medical examination without abnormal findings: Secondary | ICD-10-CM | POA: Diagnosis not present

## 2021-03-30 DIAGNOSIS — Z01419 Encounter for gynecological examination (general) (routine) without abnormal findings: Secondary | ICD-10-CM | POA: Diagnosis not present

## 2021-04-22 DIAGNOSIS — R87619 Unspecified abnormal cytological findings in specimens from cervix uteri: Secondary | ICD-10-CM | POA: Diagnosis not present

## 2021-04-22 DIAGNOSIS — Z3202 Encounter for pregnancy test, result negative: Secondary | ICD-10-CM | POA: Diagnosis not present

## 2021-04-22 DIAGNOSIS — D390 Neoplasm of uncertain behavior of uterus: Secondary | ICD-10-CM | POA: Diagnosis not present

## 2021-04-22 DIAGNOSIS — N859 Noninflammatory disorder of uterus, unspecified: Secondary | ICD-10-CM | POA: Diagnosis not present

## 2021-05-02 ENCOUNTER — Other Ambulatory Visit: Payer: Self-pay | Admitting: Internal Medicine

## 2021-05-02 DIAGNOSIS — Z1231 Encounter for screening mammogram for malignant neoplasm of breast: Secondary | ICD-10-CM

## 2021-05-03 ENCOUNTER — Ambulatory Visit
Admission: RE | Admit: 2021-05-03 | Discharge: 2021-05-03 | Disposition: A | Discharge status: Home / Self Care | Payer: BC Managed Care – PPO | Source: Ambulatory Visit | Attending: Internal Medicine | Admitting: Internal Medicine

## 2021-05-03 ENCOUNTER — Other Ambulatory Visit: Payer: Self-pay

## 2021-05-03 DIAGNOSIS — Z1231 Encounter for screening mammogram for malignant neoplasm of breast: Secondary | ICD-10-CM | POA: Diagnosis not present

## 2021-05-18 DIAGNOSIS — R87619 Unspecified abnormal cytological findings in specimens from cervix uteri: Secondary | ICD-10-CM | POA: Diagnosis not present

## 2021-06-23 DIAGNOSIS — R87619 Unspecified abnormal cytological findings in specimens from cervix uteri: Secondary | ICD-10-CM

## 2021-06-23 HISTORY — DX: Unspecified abnormal cytological findings in specimens from cervix uteri: R87.619

## 2021-07-08 ENCOUNTER — Encounter (HOSPITAL_BASED_OUTPATIENT_CLINIC_OR_DEPARTMENT_OTHER): Payer: Self-pay | Admitting: Obstetrics and Gynecology

## 2021-07-08 ENCOUNTER — Other Ambulatory Visit: Payer: Self-pay

## 2021-07-08 NOTE — Progress Notes (Signed)
Spoke w/ via phone for pre-op interview--- pt Lab needs dos----  urine preg (per anes)/  pre-op orders pending            Lab results------ no COVID test -----patient states asymptomatic no test needed Arrive at ------- 1200 on 07-12-2021 NPO after MN NO Solid Food.  Clear liquids from MN until--- 1100 Med rec completed Medications to take morning of surgery ----- none Diabetic medication ----- n/a Patient instructed no nail polish to be worn day of surgery Patient instructed to bring photo id and insurance card day of surgery Patient aware to have Driver (ride ) / caregiver    for 24 hours after surgery --- Pt verbalized understanding that she has to have a responsible person age 44 and over to drive her home and a caregiver due to anesthesia, she is have name/ phone numbers for both when she check's in [place] Patient Special Instructions ----- n/a Pre-Op special Istructions ----- sent inbox message to dr Richardson Dopp in epic, requested orders Patient verbalized understanding of instructions that were given at this phone interview. Patient denies shortness of breath, chest pain, fever, cough at this phone interview.

## 2021-07-11 ENCOUNTER — Other Ambulatory Visit: Payer: Self-pay | Admitting: Obstetrics and Gynecology

## 2021-07-11 DIAGNOSIS — R87619 Unspecified abnormal cytological findings in specimens from cervix uteri: Secondary | ICD-10-CM

## 2021-07-11 NOTE — H&P (Signed)
Reason for Appointment  1. 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 Does patient report new onset of COVID symptoms? No. Has patient or close contact tested positive for COVID-19? No , not in the past 2 weeks.  General:  55 y/o presents to discuss management of abnormal endometrial biopsy. Endometrial cells were seen on a pap smear performed 03/30/2021. pt has not had abnormal bleeding. however the biopsy revealed. atypical glandular proliferation. According to the pathologist this was concerning for possible underlying hyperplasia. The pathologist suggested Hysteroscopy D&C for further evaluation.    Current Medications  Taking   MVI 1 tab Oral   Medication List reviewed and reconciled with the patient   Past Medical History  Medical History Verified.    Surgical History  c-section 54   Family History  Father: alive  Mother: deceased, diagnosed with ESRD (end stage renal disease)  1 brother(s) , 2 sister(s) - healthy.   denies any GYN family cancer hx, No Family History of Colon Cancer.   Social History  General:  Tobacco use cigarettes: Never smoked, Tobacco history last updated 05/18/2021. no Alcohol. no Caffeine. no Recreational drug use. OCCUPATION: employed, Art gallery manager.    Gyn History  Sexual activity currently sexually active.  Periods : every month, regular.  LMP 05/07/2021.  Denies H/O Birth control.  Last pap smear date 03/30/2021- Endometrial cells present on Pap. Negative for high-risk HPV..  Last mammogram date 05/03/2021- normal.  Denies H/O Abnormal pap smear.  Denies H/O STD.  Menarche 29.    OB History  Number of pregnancies 6.  Pregnancy # 1 boy, vaginal delivery.  Pregnancy # 2 boy, vaginal delivery.  Pregnancy # 3 boy, vaginal delivery.  Pregnancy # 4: twins, boy vaginal, girl c-section.  Pregnancy # 5: Boy, vaginal delivery.  Pregnancy # 6 girl, vaginal  delivery.    Allergies  N.K.D.A.   Hospitalization/Major Diagnostic Procedure  childbirth 1994   Review of Systems  CONSTITUTIONAL:  Chills No. Fatigue No. Fever No. Night sweats No. Recent travel outside [place] No. Sweats No. Weight change No.  OPHTHALMOLOGY:  Blurring of vision no. Change in vision no. Double vision no.  ENT:  Dizziness no. Nose bleeds no. Sore throat no. Teeth pain no.  ALLERGY:  Hives no.  CARDIOLOGY:  Chest pain no. High blood pressure no. Irregular heart beat no. Leg edema no. Palpitations no.  RESPIRATORY:  Shortness of breath no. Cough no. Wheezing no.  UROLOGY:  Pain with urination no. Urinary urgency no. Urinary frequency no. Urinary incontinence no. Difficulty urinating No. Blood in urine No.  GASTROENTEROLOGY:  Abdominal pain no. Appetite change no. Bloating/belching no. Blood in stool or on toilet paper no. Change in bowel movements no. Constipation no. Diarrhea no. Difficulty swallowing no. Nausea no.  FEMALE REPRODUCTIVE:  Vulvar pain no. Vulvar rash no. Abnormal vaginal bleeding no. Breast pain no. Nipple discharge no. Pain with intercourse no. Pelvic pain no. Unusual vaginal discharge no. Vaginal itching no.  MUSCULOSKELETAL:  Muscle aches no.  NEUROLOGY:  Headache no. Tingling/numbness no. Weakness no.  PSYCHOLOGY:  Depression no. Anxiety no. Nervousness no. Sleep disturbances no. Suicidal ideation no .  ENDOCRINOLOGY:  Excessive thirst no. Excessive urination no. Hair loss no. Heat or cold intolerance no.  HEMATOLOGY/LYMPH:  Abnormal bleeding no. Easy bruising no. Swollen glands no.  DERMATOLOGY:  New/changing skin lesion no. Rash no. Sores no.    Vital Signs  Wt 204.6,  Wt change 3.8 lbs, Ht 63, BMI 36.24, Pulse sitting 69, BP sitting 107/74.   Examination  General Examination: CONSTITUTIONAL: alert, oriented, NAD . SKIN: moist, warm. EYES: Conjunctiva clear. LUNGS: good I:E efffort noted, clear to auscultation bilaterally. HEART: regular  rate and rhythm. ABDOMEN: soft, non-tender/non-distended, bowel sounds present . FEMALE GENITOURINARY: normal external genitalia, labia - unremarkable, vagina - pink moist mucosa, no lesions or abnormal discharge, cervix - no discharge or lesions or CMT, adnexa - no masses or tenderness, uterus - nontender and normal size on palpation . PSYCH: affect normal, good eye contact.     Assessments     1. Endometrial cells on cervical Pap smear inconsistent w/LMP - R87.619 (Primary)   Treatment  1. Endometrial cells on cervical Pap smear inconsistent w/LMP  Notes: given abnormal endometrial biopsy recommend hysteroscopy D&C and possible polypectomy with myosure. r/b/a of surgery discussed with patient including but not limited to infection/ bleeding possible perforation of the uterus with the need for further surgery. pt voiced understanding and desires to proceed.

## 2021-07-11 NOTE — Progress Notes (Signed)
Spoke with Dr. Richardson Dopp at this time regarding patient needing orders entered for procedure tomorrow. Dr. Richardson Dopp states she will get orders in for patient.

## 2021-07-12 ENCOUNTER — Encounter (HOSPITAL_BASED_OUTPATIENT_CLINIC_OR_DEPARTMENT_OTHER): Payer: Self-pay | Admitting: Anesthesiology

## 2021-07-12 ENCOUNTER — Encounter (HOSPITAL_BASED_OUTPATIENT_CLINIC_OR_DEPARTMENT_OTHER): Payer: Self-pay | Admitting: Obstetrics and Gynecology

## 2021-07-12 ENCOUNTER — Ambulatory Visit (HOSPITAL_BASED_OUTPATIENT_CLINIC_OR_DEPARTMENT_OTHER)
Admission: RE | Admit: 2021-07-12 | Payer: BC Managed Care – PPO | Source: Home / Self Care | Admitting: Obstetrics and Gynecology

## 2021-07-12 HISTORY — DX: Partial loss of teeth, unspecified cause, unspecified class: K08.409

## 2021-07-12 SURGERY — DILATATION & CURETTAGE/HYSTEROSCOPY WITH MYOSURE
Anesthesia: Choice

## 2021-07-12 MED ORDER — BUPIVACAINE HCL (PF) 0.25 % IJ SOLN
INTRAMUSCULAR | Status: AC
  Filled 2021-07-12: qty 30

## 2021-07-12 MED ORDER — SILVER NITRATE-POT NITRATE 75-25 % EX MISC
CUTANEOUS | Status: AC
  Filled 2021-07-12: qty 10

## 2021-08-11 DIAGNOSIS — Z3202 Encounter for pregnancy test, result negative: Secondary | ICD-10-CM | POA: Diagnosis not present

## 2021-08-11 DIAGNOSIS — R87619 Unspecified abnormal cytological findings in specimens from cervix uteri: Secondary | ICD-10-CM | POA: Diagnosis not present

## 2021-09-05 ENCOUNTER — Ambulatory Visit (HOSPITAL_BASED_OUTPATIENT_CLINIC_OR_DEPARTMENT_OTHER): Admit: 2021-09-05 | Payer: BC Managed Care – PPO | Admitting: Obstetrics and Gynecology

## 2021-09-05 SURGERY — DILATATION & CURETTAGE/HYSTEROSCOPY WITH MYOSURE
Anesthesia: Choice

## 2021-09-10 ENCOUNTER — Other Ambulatory Visit: Payer: Self-pay

## 2021-09-10 ENCOUNTER — Emergency Department (HOSPITAL_COMMUNITY)
Admission: EM | Admit: 2021-09-10 | Discharge: 2021-09-11 | Disposition: A | Discharge status: Home / Self Care | Payer: BC Managed Care – PPO | Attending: Emergency Medicine | Admitting: Emergency Medicine

## 2021-09-10 ENCOUNTER — Encounter (HOSPITAL_COMMUNITY): Payer: Self-pay | Admitting: Emergency Medicine

## 2021-09-10 DIAGNOSIS — R197 Diarrhea, unspecified: Secondary | ICD-10-CM | POA: Insufficient documentation

## 2021-09-10 DIAGNOSIS — R1013 Epigastric pain: Secondary | ICD-10-CM | POA: Diagnosis not present

## 2021-09-10 DIAGNOSIS — R112 Nausea with vomiting, unspecified: Secondary | ICD-10-CM | POA: Diagnosis not present

## 2021-09-10 MED ORDER — ONDANSETRON 4 MG PO TBDP
4.0000 mg | ORAL_TABLET | Freq: Once | ORAL | Status: AC | PRN
  Administered 2021-09-10: 4 mg via ORAL
  Filled 2021-09-10: qty 1

## 2021-09-10 NOTE — ED Triage Notes (Signed)
Patient states she had fries at [employer] last night, which she never eats, and woke up today with stomach cramping and began having N/V/D when she got to work.

## 2021-09-11 LAB — COMPREHENSIVE METABOLIC PANEL
ALT: 27 U/L (ref 0–44)
AST: 29 U/L (ref 15–41)
Albumin: 3.9 g/dL (ref 3.5–5.0)
Alkaline Phosphatase: 65 U/L (ref 38–126)
Anion gap: 9 (ref 5–15)
BUN: 11 mg/dL (ref 6–20)
CO2: 24 mmol/L (ref 22–32)
Calcium: 9 mg/dL (ref 8.9–10.3)
Chloride: 103 mmol/L (ref 98–111)
Creatinine, Ser: 0.8 mg/dL (ref 0.44–1.00)
GFR, Estimated: >60 mL/min (ref >60)
Glucose, Bld: 119 mg/dL — ABNORMAL HIGH (ref 70–99)
Potassium: 3.9 mmol/L (ref 3.5–5.1)
Sodium: 136 mmol/L (ref 135–145)
Total Bilirubin: 0.6 mg/dL (ref 0.3–1.2)
Total Protein: 7.7 g/dL (ref 6.5–8.1)

## 2021-09-11 LAB — I-STAT BETA HCG BLOOD, ED (MC, WL, AP ONLY): I-stat hCG, quantitative: <5 m[IU]/mL (ref <5)

## 2021-09-11 LAB — CBC
HCT: 43.8 % (ref 36.0–46.0)
Hemoglobin: 14.5 g/dL (ref 12.0–15.0)
MCH: 31 pg (ref 26.0–34.0)
MCHC: 33.1 g/dL (ref 30.0–36.0)
MCV: 93.6 fL (ref 80.0–100.0)
Platelets: 208 10*3/uL (ref 150–400)
RBC: 4.68 MIL/uL (ref 3.87–5.11)
RDW: 13.5 % (ref 11.5–15.5)
WBC: 8.4 10*3/uL (ref 4.0–10.5)
nRBC: 0 % (ref 0.0–0.2)

## 2021-09-11 LAB — LIPASE, BLOOD: Lipase: 23 U/L (ref 11–51)

## 2021-09-11 MED ORDER — ONDANSETRON 4 MG PO TBDP: 4.0000 mg | ORAL_TABLET | Freq: Three times a day (TID) | ORAL | 0 refills | Status: DC | PRN

## 2021-09-11 MED ORDER — SODIUM CHLORIDE 0.9 % IV BOLUS
1000.0000 mL | Freq: Once | INTRAVENOUS | Status: AC
  Administered 2021-09-11: 1000 mL via INTRAVENOUS

## 2021-09-11 MED ORDER — DICYCLOMINE HCL 10 MG PO CAPS
20.0000 mg | ORAL_CAPSULE | Freq: Once | ORAL | Status: AC
Start: 2021-09-11 — End: 2021-09-11
  Administered 2021-09-11: 20 mg via ORAL
  Filled 2021-09-11: qty 2

## 2021-09-11 MED ORDER — DICYCLOMINE HCL 20 MG PO TABS: 20.0000 mg | ORAL_TABLET | Freq: Two times a day (BID) | ORAL | 0 refills | Status: DC

## 2021-09-11 NOTE — ED Notes (Signed)
Patient informed that we are still waiting for her urine sample

## 2021-09-11 NOTE — ED Provider Notes (Signed)
[facility]-EMERGENCY DEPT Provider Note   CSN: [id number] Arrival date & time: 09/10/21  2258     History  Chief Complaint  Patient presents with   Abdominal Pain   Emesis    Kayla Frazier is a 56 y.o. female.  Who presents with nausea vomiting and diarrhea.  Patient states she began having epigastric abdominal cramping earlier today in the morning which got progressively worse followed by an extensive amount of vomiting and diarrhea.  She has had cramping pain in her abdomen which is at times very sharp.  She was given Zofran prior to my evaluation and has no nausea at this time.  She denies fever or chills.  She states that she did eat [place] fries from the [employer] on [address] and feels like she might have gotten sick from that.  She has several relatives who also ate there but did not get sick.   Abdominal Pain Associated symptoms: vomiting   Emesis Associated symptoms: abdominal pain       Home Medications Prior to Admission medications   Medication Sig Start Date End Date Taking? Authorizing Provider  dicyclomine (BENTYL) 20 MG tablet Take 1 tablet (20 mg total) by mouth 2 (two) times daily. 09/11/21  Yes Zelphia Glover, Cammy Copa, PA-C  Multiple Vitamin (MULTIVITAMIN ADULT PO) Take by mouth daily.   Yes [provider]  ondansetron (ZOFRAN-ODT) 4 MG disintegrating tablet Take 1 tablet (4 mg total) by mouth every 8 (eight) hours as needed for nausea. 09/11/21  Yes Shyne Lehrke, PA-C  acetaminophen (TYLENOL) 500 MG tablet Take 500 mg by mouth every 6 (six) hours as needed. Patient not taking: Reported on 09/11/2021    [provider]      Allergies    Codeine and Oxycodone    Review of Systems   Review of Systems  Gastrointestinal:  Positive for abdominal pain and vomiting.   Physical Exam Updated Vital Signs BP 120/70    Pulse 89    Temp 99.4 F (37.4 C) (Oral)    Resp 18    Ht 5\' 3"  (1.6 m)    Wt 83.9 kg    SpO2  99%    BMI 32.77 kg/m  Physical Exam Vitals and nursing note reviewed.  Constitutional:      General: She is not in acute distress.    Appearance: She is well-developed. She is not diaphoretic.  HENT:     Head: Normocephalic and atraumatic.     Right Ear: External ear normal.     Left Ear: External ear normal.     Nose: Nose normal.     Mouth/Throat:     Mouth: Mucous membranes are moist.  Eyes:     General: No scleral icterus.    Conjunctiva/sclera: Conjunctivae normal.  Cardiovascular:     Rate and Rhythm: Normal rate and regular rhythm.     Heart sounds: Normal heart sounds. No murmur heard.   No friction rub. No gallop.  Pulmonary:     Effort: Pulmonary effort is normal. No respiratory distress.     Breath sounds: Normal breath sounds.  Abdominal:     General: Bowel sounds are normal. There is no distension.     Palpations: Abdomen is soft. There is no mass.     Tenderness: There is no abdominal tenderness. There is no guarding.  Musculoskeletal:     Cervical back: Normal range of motion.  Skin:    General: Skin is warm and dry.  Neurological:     Mental Status: She is alert and oriented to person, place, and time.  Psychiatric:        Behavior: Behavior normal.    ED Results / Procedures / Treatments   Labs (all labs ordered are listed, but only abnormal results are displayed) Labs Reviewed  COMPREHENSIVE METABOLIC PANEL - Abnormal; Notable for the following components:      Result Value   Glucose, Bld 119 (*)    All other components within normal limits  LIPASE, BLOOD  CBC  I-STAT BETA HCG BLOOD, ED ([facility], [facility], [facility] ONLY)    EKG None  Radiology No results found.  Procedures Procedures    Medications Ordered in ED Medications  ondansetron (ZOFRAN-ODT) disintegrating tablet 4 mg (4 mg Oral Given 09/10/21 2354)  sodium chloride 0.9 % bolus 1,000 mL (0 mLs Intravenous Stopped 09/11/21 0305)  dicyclomine (BENTYL) capsule 20 mg (20 mg Oral Given 09/11/21 0143)     ED Course/ Medical Decision Making/ A&P                           Medical Decision Making Patient here wth nvd The emergent differential diagnosis for vomiting includes, but is not limited to ACS/MI, DKA, Ischemic bowel, Meningitis, Sepsis, Acute gastric dilation, Adrenal insufficiency, Appendicitis,  Bowel obstruction/ileus, Carbon monoxide poisoning, Cholecystitis, Electrolyte abnormalities, Elevated ICP, Gastric outlet obstruction, Pancreatitis, Ruptured viscus, Biliary colic, Cannabinoid hyperemesis syndrome, Gastritis, Gastroenteritis, Gastroparesis,  Narcotic withdrawal, Peptic ulcer disease, and UTI' Reassuring labs and benign exam Given IV and PO fluids + bentyl.feels improved- unable to provide urine bur asymptomatic and safe for dc with zofran I considered ct abd and pelvis but exam and labs benign and it is not appropriate in to setting of no focal abd pain    Amount and/or Complexity of Data Reviewed Labs: ordered.    Details: No acute findings- mildly elevated glu likely acute phase reactant  Risk Prescription drug management.  hy or why not admission, treatments were needed:1} Final Clinical Impression(s) / ED Diagnoses Final diagnoses:  Nausea vomiting and diarrhea    Rx / DC Orders ED Discharge Orders          Ordered    ondansetron (ZOFRAN-ODT) 4 MG disintegrating tablet  Every 8 hours PRN        09/11/21 0415    dicyclomine (BENTYL) 20 MG tablet  2 times daily        09/11/21 0415              Arthor Captain, PA-C 09/11/21 1830    Mesner, Barbara Cower, MD 09/13/21 [phone]

## 2021-09-11 NOTE — Discharge Instructions (Signed)
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
## 2021-11-15 IMAGING — MG MM DIGITAL SCREENING BILAT W/ TOMO AND CAD
8 series · 8 of 24 positions shown · non-contrast
Comparison: Previous exam(s).

CLINICAL DATA: Screening.

EXAM:
DIGITAL SCREENING BILATERAL MAMMOGRAM WITH TOMOSYNTHESIS AND CAD
TECHNIQUE: Bilateral screening digital craniocaudal and mediolateral oblique
mammograms were obtained. Bilateral screening digital breast
tomosynthesis was performed. The images were evaluated with
computer-aided detection.

[R CC synth-2D]
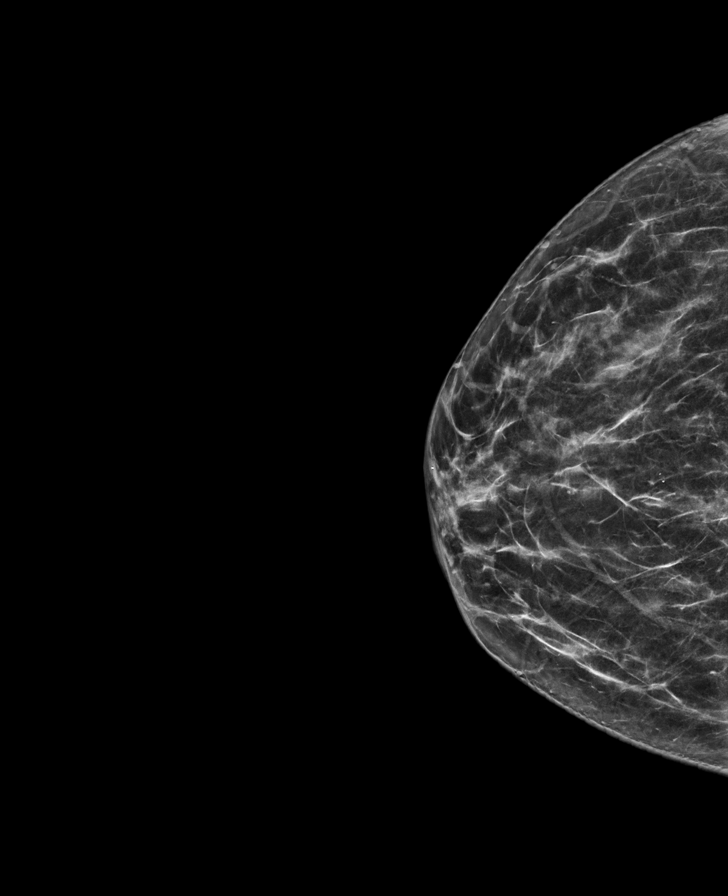

[R MLO synth-2D]
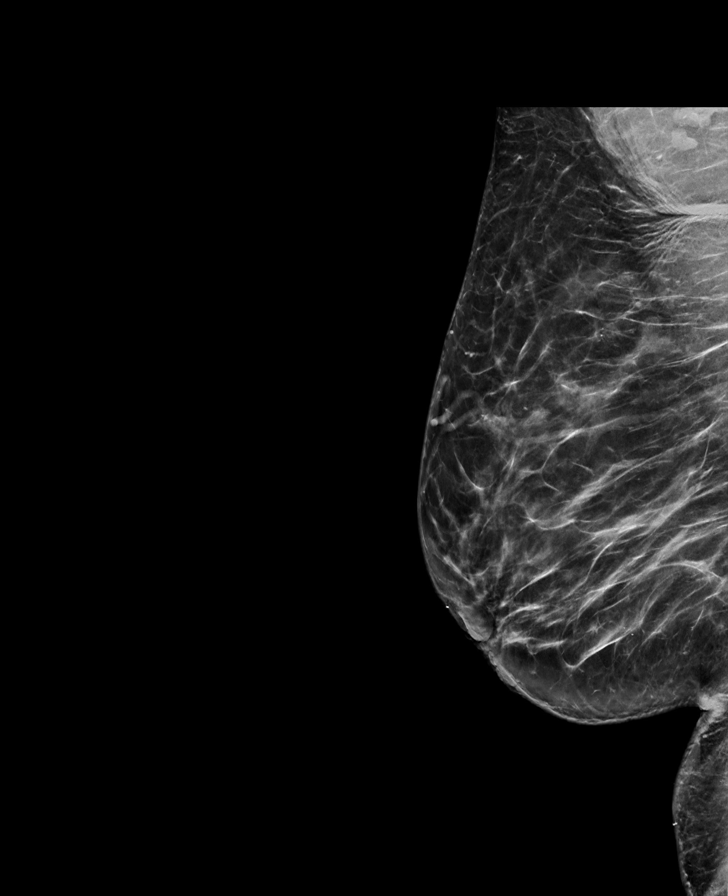

[L MLO synth-2D]
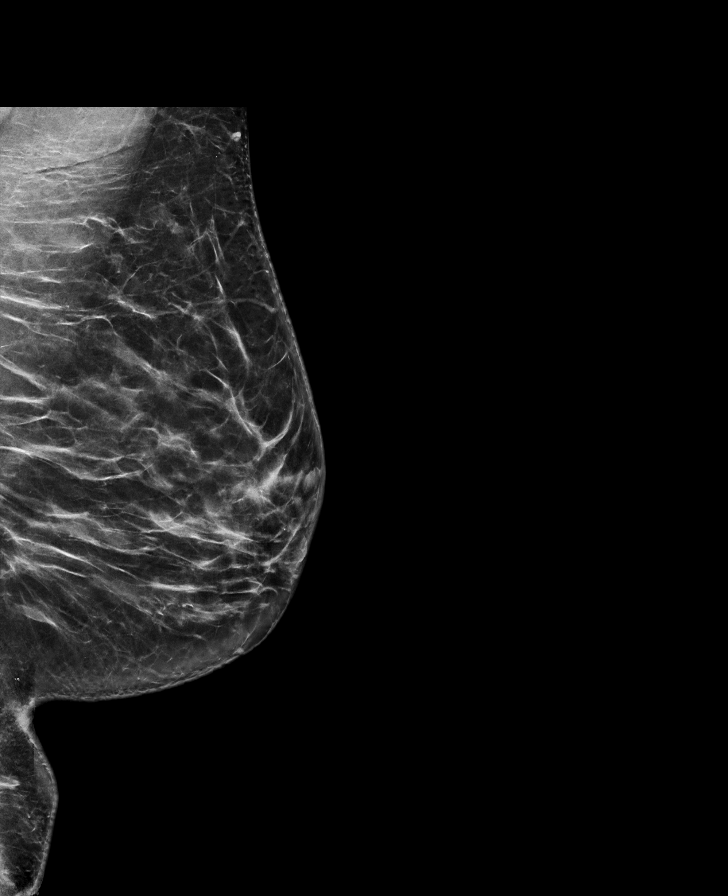

[L CC synth-2D]
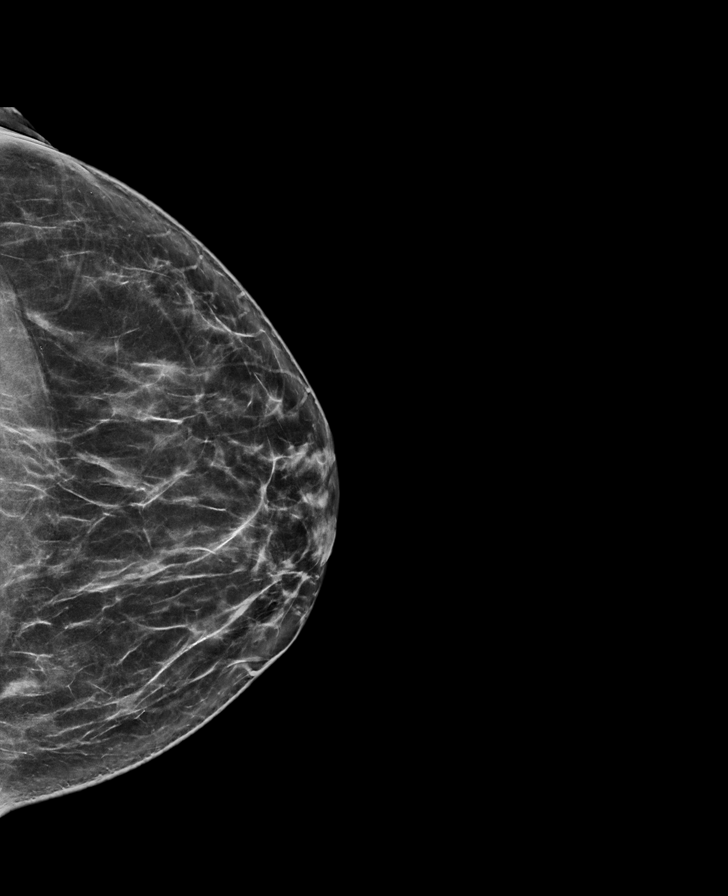

[R MLO tomo · tomo slice 39/78.0]
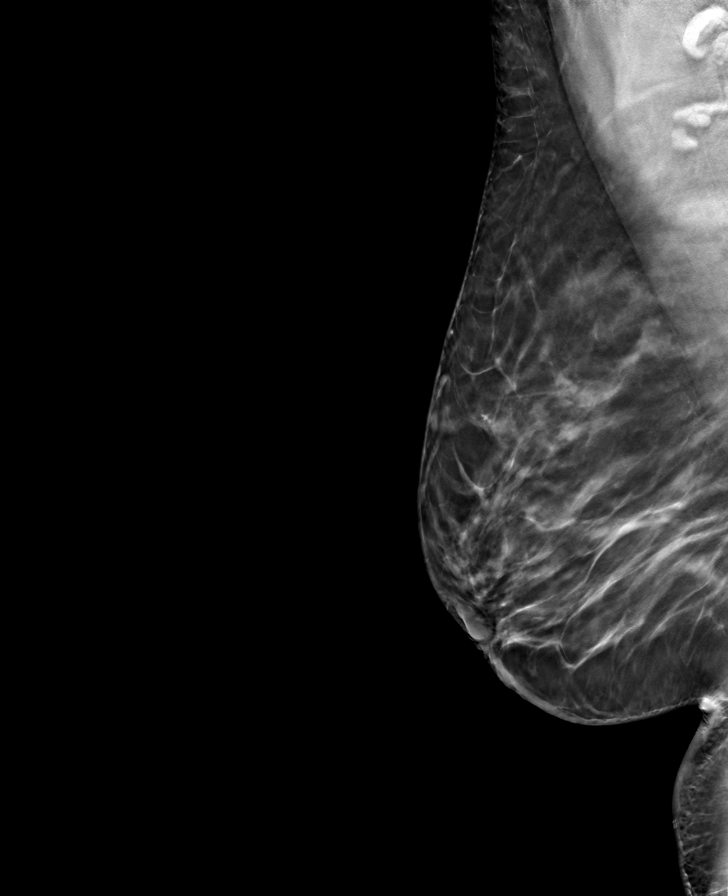

[L MLO tomo · tomo slice 37/74.0]
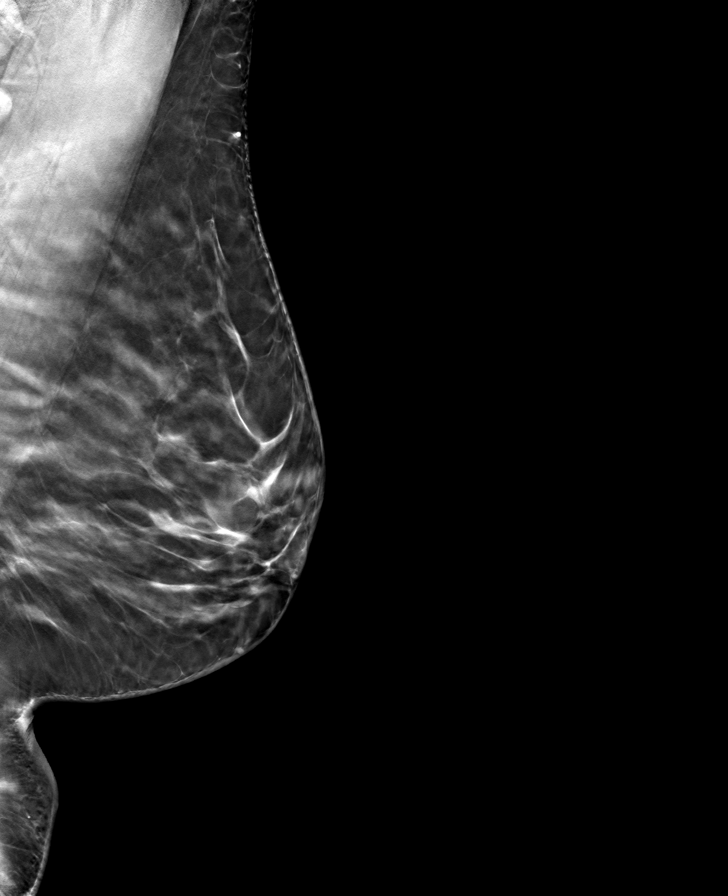

[R CC tomo · tomo slice 32/63.0]
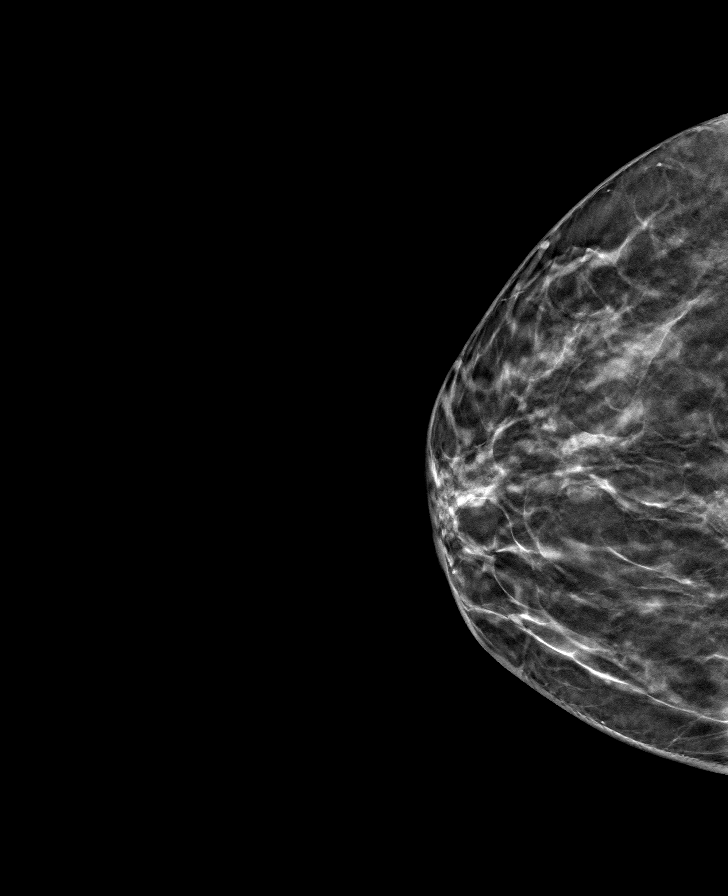

[L CC tomo · tomo slice 37/72.0]
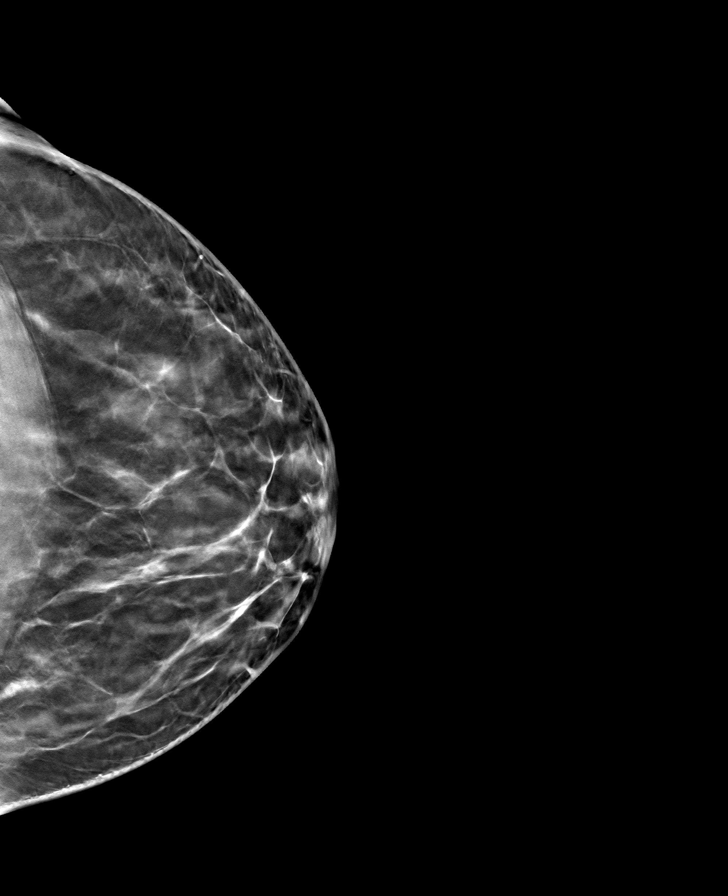

[8 of 24 positions shown; findings below may reference images not displayed]

ACR Breast Density Category b: There are scattered areas of
fibroglandular density.
FINDINGS: There are no findings suspicious for malignancy.
IMPRESSION: No mammographic evidence of malignancy. A result letter of this
screening mammogram will be mailed directly to the patient.

RECOMMENDATION:
Screening mammogram in one year. (Code:51-O-LD2)

BI-RADS CATEGORY  1: Negative.

## 2022-01-17 DIAGNOSIS — F419 Anxiety disorder, unspecified: Secondary | ICD-10-CM | POA: Diagnosis not present

## 2022-01-17 DIAGNOSIS — R002 Palpitations: Secondary | ICD-10-CM | POA: Diagnosis not present

## 2022-01-21 DIAGNOSIS — Z6834 Body mass index (BMI) 34.0-34.9, adult: Secondary | ICD-10-CM | POA: Diagnosis not present

## 2022-01-21 DIAGNOSIS — E669 Obesity, unspecified: Secondary | ICD-10-CM | POA: Diagnosis not present

## 2022-01-23 DIAGNOSIS — F419 Anxiety disorder, unspecified: Secondary | ICD-10-CM | POA: Diagnosis not present

## 2022-02-06 DIAGNOSIS — F419 Anxiety disorder, unspecified: Secondary | ICD-10-CM | POA: Diagnosis not present

## 2022-02-14 ENCOUNTER — Emergency Department (HOSPITAL_COMMUNITY)
Admission: EM | Admit: 2022-02-14 | Discharge: 2022-02-14 | Disposition: A | Discharge status: Home / Self Care | Payer: BC Managed Care – PPO | Attending: Emergency Medicine | Admitting: Emergency Medicine

## 2022-02-14 ENCOUNTER — Encounter (HOSPITAL_COMMUNITY): Payer: Self-pay

## 2022-02-14 ENCOUNTER — Other Ambulatory Visit: Payer: Self-pay

## 2022-02-14 ENCOUNTER — Emergency Department (HOSPITAL_COMMUNITY): Payer: BC Managed Care – PPO

## 2022-02-14 DIAGNOSIS — M542 Cervicalgia: Secondary | ICD-10-CM | POA: Insufficient documentation

## 2022-02-14 DIAGNOSIS — R519 Headache, unspecified: Secondary | ICD-10-CM | POA: Diagnosis not present

## 2022-02-14 DIAGNOSIS — S060X0A Concussion without loss of consciousness, initial encounter: Secondary | ICD-10-CM | POA: Diagnosis not present

## 2022-02-14 DIAGNOSIS — S0990XA Unspecified injury of head, initial encounter: Secondary | ICD-10-CM | POA: Diagnosis not present

## 2022-02-14 DIAGNOSIS — W500XXA Accidental hit or strike by another person, initial encounter: Secondary | ICD-10-CM | POA: Insufficient documentation

## 2022-02-14 MED ORDER — IBUPROFEN 800 MG PO TABS: 800.0000 mg | ORAL_TABLET | Freq: Three times a day (TID) | ORAL | 0 refills | Status: AC | PRN
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
## 2023-02-01 ENCOUNTER — Emergency Department (HOSPITAL_COMMUNITY)
Admission: EM | Admit: 2023-02-01 | Discharge: 2023-02-01 | Disposition: A | Discharge status: Home / Self Care | Payer: 59 | Attending: Emergency Medicine | Admitting: Emergency Medicine

## 2023-02-01 ENCOUNTER — Encounter (HOSPITAL_COMMUNITY): Payer: Self-pay | Admitting: Emergency Medicine

## 2023-02-01 ENCOUNTER — Emergency Department (HOSPITAL_COMMUNITY): Payer: 59

## 2023-02-01 DIAGNOSIS — M5030 Other cervical disc degeneration, unspecified cervical region: Secondary | ICD-10-CM | POA: Diagnosis not present

## 2023-02-01 DIAGNOSIS — M25512 Pain in left shoulder: Secondary | ICD-10-CM | POA: Diagnosis not present

## 2023-02-01 DIAGNOSIS — R519 Headache, unspecified: Secondary | ICD-10-CM | POA: Diagnosis not present

## 2023-02-01 DIAGNOSIS — R202 Paresthesia of skin: Secondary | ICD-10-CM | POA: Diagnosis not present

## 2023-02-01 DIAGNOSIS — S4992XA Unspecified injury of left shoulder and upper arm, initial encounter: Secondary | ICD-10-CM | POA: Diagnosis not present

## 2023-02-01 DIAGNOSIS — J029 Acute pharyngitis, unspecified: Secondary | ICD-10-CM | POA: Insufficient documentation

## 2023-02-01 DIAGNOSIS — S0993XA Unspecified injury of face, initial encounter: Secondary | ICD-10-CM | POA: Diagnosis not present

## 2023-02-01 DIAGNOSIS — S199XXA Unspecified injury of neck, initial encounter: Secondary | ICD-10-CM | POA: Diagnosis not present

## 2023-02-01 DIAGNOSIS — S299XXA Unspecified injury of thorax, initial encounter: Secondary | ICD-10-CM | POA: Diagnosis not present

## 2023-02-01 DIAGNOSIS — M542 Cervicalgia: Secondary | ICD-10-CM | POA: Diagnosis not present

## 2023-02-01 LAB — CBC WITH DIFFERENTIAL/PLATELET
Abs Immature Granulocytes: 0.01 10*3/uL (ref 0.00–0.07)
Basophils Absolute: 0.1 10*3/uL (ref 0.0–0.1)
Basophils Relative: 1 %
Eosinophils Absolute: 0.2 10*3/uL (ref 0.0–0.5)
Eosinophils Relative: 2 %
HCT: 39.9 % (ref 36.0–46.0)
Hemoglobin: 13 g/dL (ref 12.0–15.0)
Immature Granulocytes: 0 %
Lymphocytes Relative: 37 %
Lymphs Abs: 3 10*3/uL (ref 0.7–4.0)
MCH: 30.4 pg (ref 26.0–34.0)
MCHC: 32.6 g/dL (ref 30.0–36.0)
MCV: 93.4 fL (ref 80.0–100.0)
Monocytes Absolute: 0.7 10*3/uL (ref 0.1–1.0)
Monocytes Relative: 8 %
Neutro Abs: 4.2 10*3/uL (ref 1.7–7.7)
Neutrophils Relative %: 52 %
Platelets: 207 10*3/uL (ref 150–400)
RBC: 4.27 MIL/uL (ref 3.87–5.11)
RDW: 14.1 % (ref 11.5–15.5)
WBC: 8.1 10*3/uL (ref 4.0–10.5)
nRBC: 0 % (ref 0.0–0.2)

## 2023-02-01 LAB — BASIC METABOLIC PANEL
Anion gap: 4 — ABNORMAL LOW (ref 5–15)
BUN: 8 mg/dL (ref 6–20)
CO2: 26 mmol/L (ref 22–32)
Calcium: 8.8 mg/dL — ABNORMAL LOW (ref 8.9–10.3)
Chloride: 106 mmol/L (ref 98–111)
Creatinine, Ser: 1 mg/dL (ref 0.44–1.00)
GFR, Estimated: >60 mL/min (ref >60)
Glucose, Bld: 104 mg/dL — ABNORMAL HIGH (ref 70–99)
Potassium: 3.8 mmol/L (ref 3.5–5.1)
Sodium: 136 mmol/L (ref 135–145)

## 2023-02-01 MED ORDER — ACETAMINOPHEN 325 MG PO TABS
650.0000 mg | ORAL_TABLET | Freq: Once | ORAL | Status: AC
  Administered 2023-02-01: 650 mg via ORAL
  Filled 2023-02-01: qty 2

## 2023-02-01 MED ORDER — IBUPROFEN 800 MG PO TABS
800.0000 mg | ORAL_TABLET | Freq: Once | ORAL | Status: AC
  Administered 2023-02-01: 800 mg via ORAL
  Filled 2023-02-01: qty 1

## 2023-02-01 MED ORDER — METHOCARBAMOL 500 MG PO TABS: 500.0000 mg | ORAL_TABLET | Freq: Two times a day (BID) | ORAL | 0 refills | Status: AC

## 2023-02-01 MED ORDER — LIDOCAINE 5 % EX PTCH: 1.0000 | MEDICATED_PATCH | CUTANEOUS | 0 refills | Status: DC

## 2023-02-01 MED ORDER — NAPROXEN 500 MG PO TABS: 500.0000 mg | ORAL_TABLET | Freq: Two times a day (BID) | ORAL | 0 refills | Status: AC

## 2023-02-01 MED ORDER — METHOCARBAMOL 500 MG PO TABS: 500.0000 mg | ORAL_TABLET | Freq: Two times a day (BID) | ORAL | 0 refills | Status: DC

## 2023-02-01 MED ORDER — IOHEXOL 300 MG/ML  SOLN
75.0000 mL | Freq: Once | INTRAMUSCULAR | Status: AC | PRN
  Administered 2023-02-01: 75 mL via INTRAVENOUS

## 2023-02-01 MED ORDER — LIDOCAINE 5 % EX PTCH: 1.0000 | MEDICATED_PATCH | CUTANEOUS | 0 refills | Status: AC

## 2023-02-01 MED ORDER — NAPROXEN 500 MG PO TABS: 500.0000 mg | ORAL_TABLET | Freq: Two times a day (BID) | ORAL | 0 refills | Status: DC
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
